# Patient Record
Sex: Female | Born: 1985 | Race: Black or African American | Hispanic: No | Marital: Single | Smoking: Current every day smoker
Health system: Southern US, Community
[De-identification: ages and names within clinical notes are randomized; demographics above are authoritative.]

## PROBLEM LIST (undated history)

## (undated) DIAGNOSIS — T7840XA Allergy, unspecified, initial encounter: Secondary | ICD-10-CM

## (undated) DIAGNOSIS — R011 Cardiac murmur, unspecified: Secondary | ICD-10-CM

## (undated) HISTORY — PX: KNEE SURGERY: SHX244

## (undated) HISTORY — DX: Allergy, unspecified, initial encounter: T78.40XA

---
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
## 2010-03-03 ENCOUNTER — Emergency Department (HOSPITAL_COMMUNITY): Admission: EM | Admit: 2010-03-03 | Discharge: 2010-03-04 | Payer: Self-pay | Admitting: Emergency Medicine

## 2010-03-05 ENCOUNTER — Ambulatory Visit (HOSPITAL_COMMUNITY): Admission: RE | Admit: 2010-03-05 | Discharge: 2010-03-05 | Payer: Self-pay | Admitting: Family Medicine

## 2010-03-05 ENCOUNTER — Emergency Department (HOSPITAL_COMMUNITY): Admission: EM | Admit: 2010-03-05 | Discharge: 2010-03-05 | Payer: Self-pay | Admitting: Family Medicine

## 2010-03-05 IMAGING — US US ABDOMEN COMPLETE
1 series · 4 of 4 positions shown · non-contrast
Comparison: None.

CLINICAL DATA: Abdominal pain.

COMPLETE ABDOMINAL ULTRASOUND

[Series 1: us abdomen complete · 0.23mm/px · 4 of 4 slices shown]
[im 1/4]
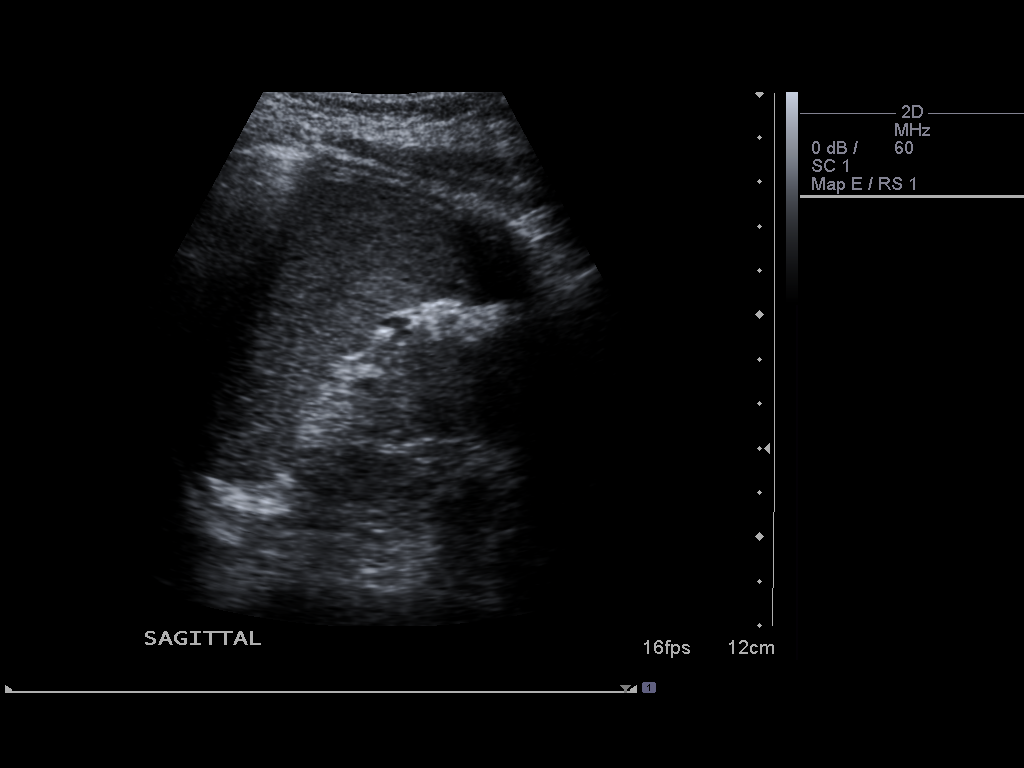
[im 2/4]
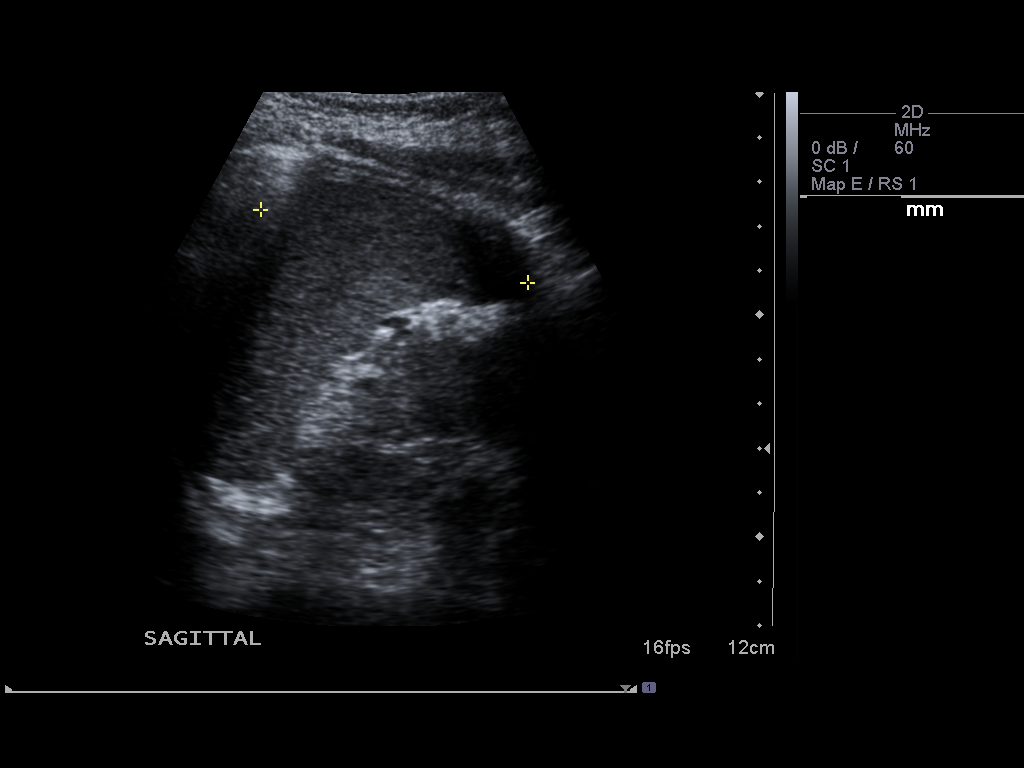
[im 3/4]
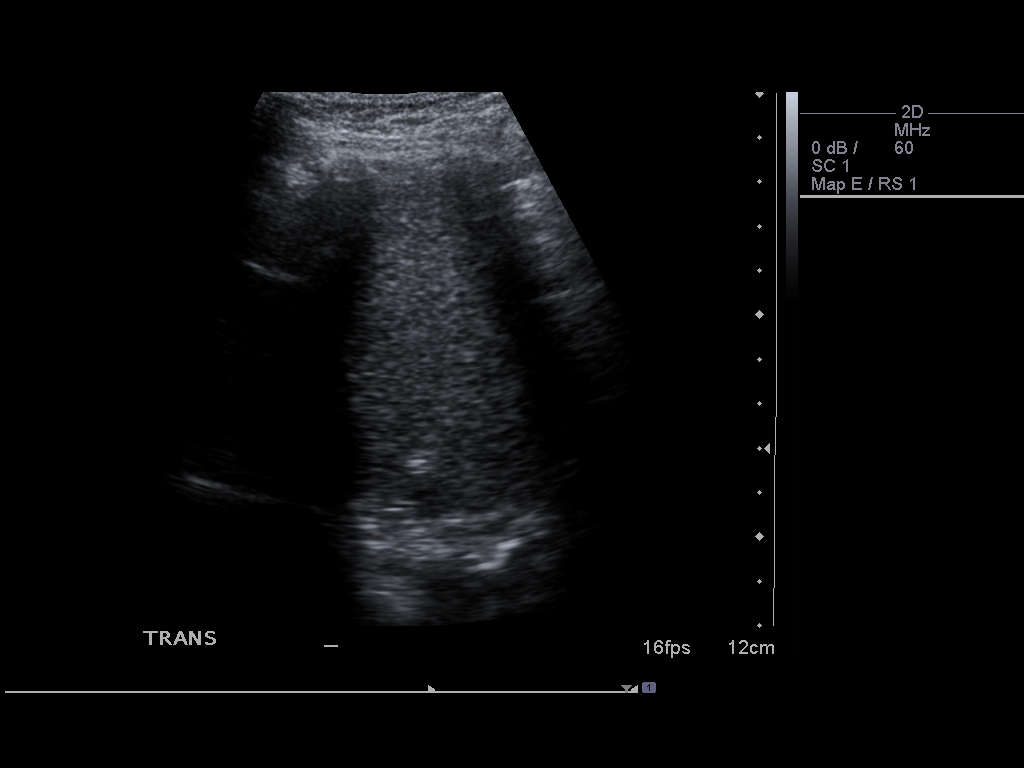
[im 4/4]
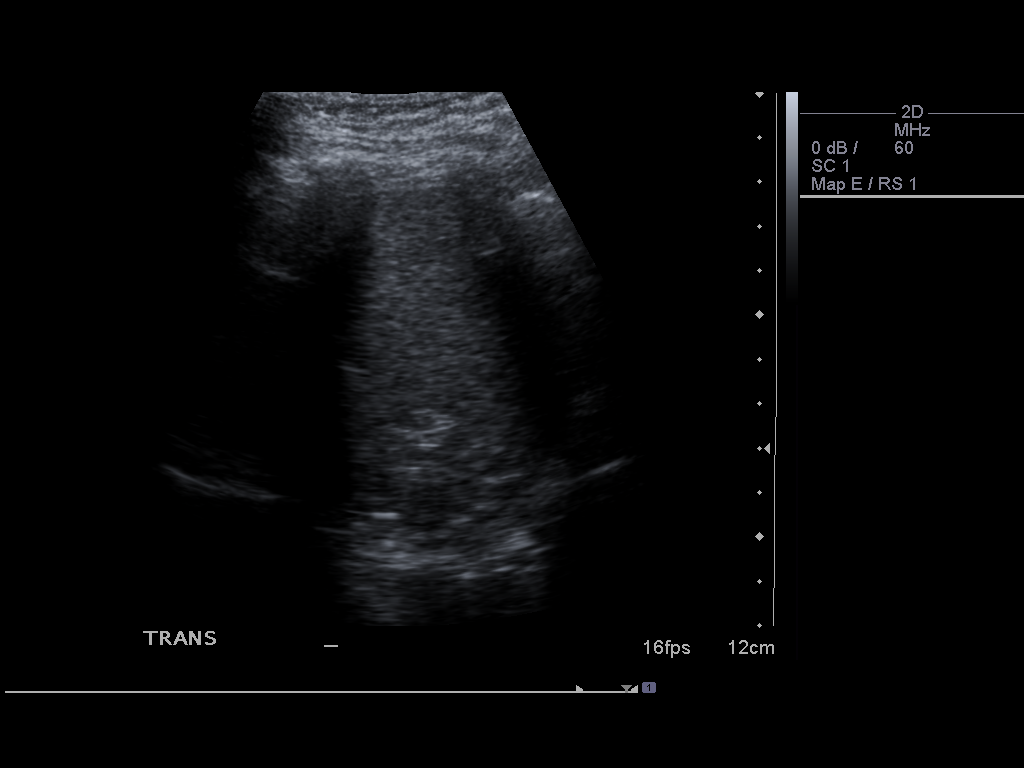

[4 of 4 positions shown; findings below may reference images not displayed]

FINDINGS: Gallbladder:  The gallbladder is nondistended.  There is no wall
thickening, stones, sludge, or pericholecystic fluid.  No
sonographic Murphy's sign.  Wall measures 1.5 mm.

Common bile duct:  2.5 mm, normal.

Liver:  No focal lesion identified.  Within normal limits in
parenchymal echogenicity.

IVC:  Appears normal.

Pancreas:  No focal abnormality seen.

Spleen:  6.2 cm.  Normal echotexture.

Right Kidney:  10.9 cm. Normal echotexture.  Normal central sinus
echo complex.  No calculi or hydronephrosis.

Left Kidney:  10.7 cm with dromedary hump. Normal echotexture.
Normal central sinus echo complex.  No calculi or hydronephrosis.

Abdominal aorta:  No aneurysm identified.
IMPRESSION: Negative abdominal ultrasound.

## 2010-03-05 IMAGING — US US ABDOMEN COMPLETE
1 series · 14 of 25 positions shown · non-contrast
Comparison: None.

CLINICAL DATA: Abdominal pain.

COMPLETE ABDOMINAL ULTRASOUND

[Series 1: us abdomen complete · 0.24mm/px · 14 of 76 slices shown]
[im 1/76]
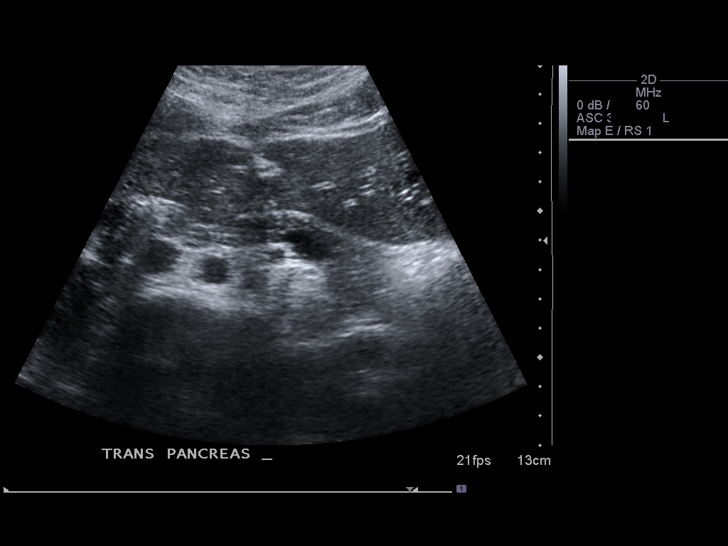
[im 7/76]
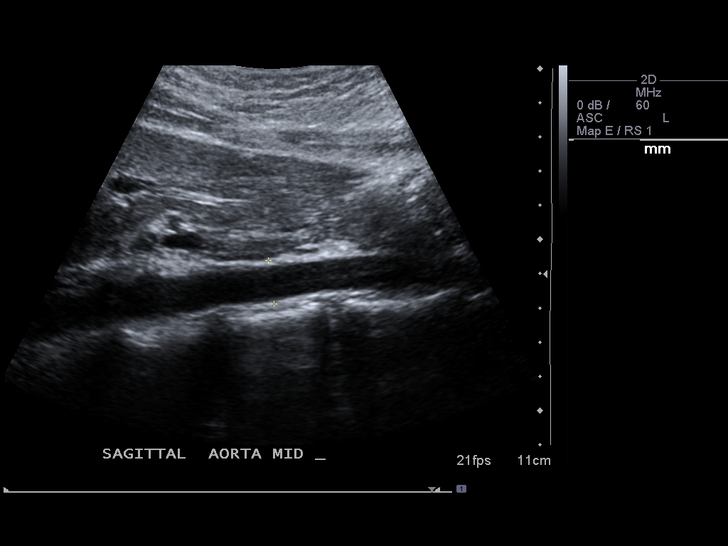
[im 13/76]
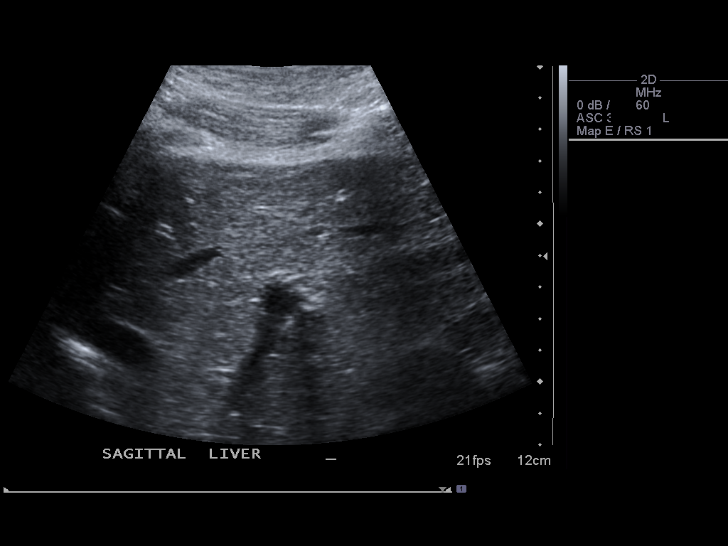
[im 19/76]
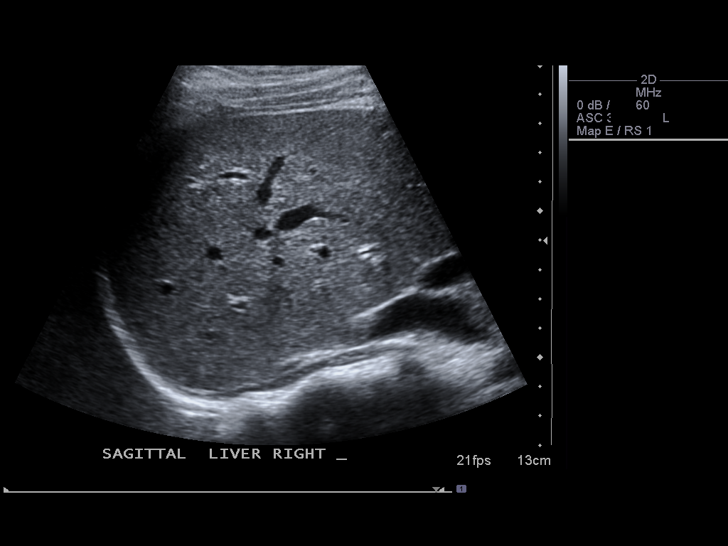
[im 26/76]
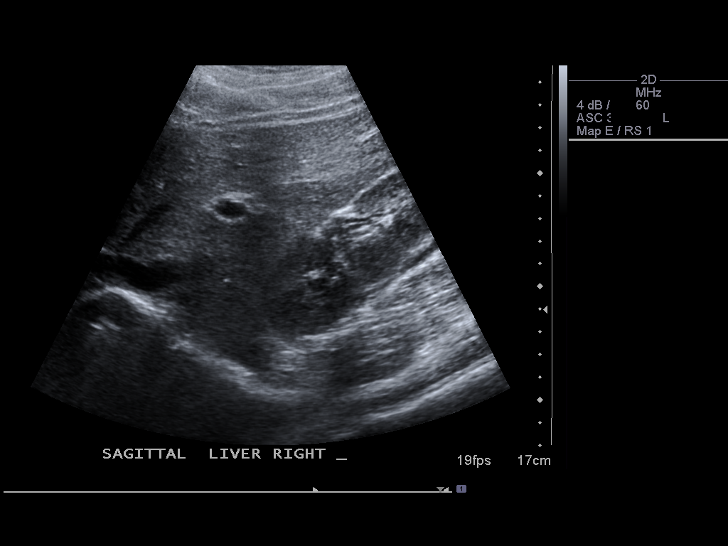
[im 29/76]
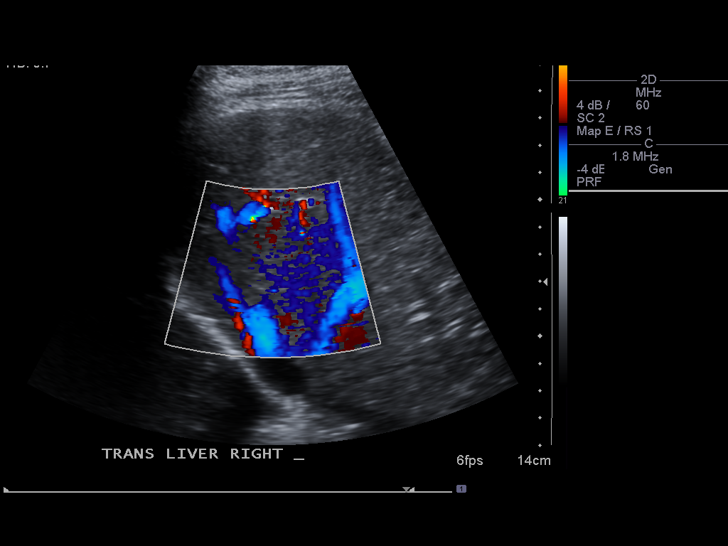
[im 35/76]
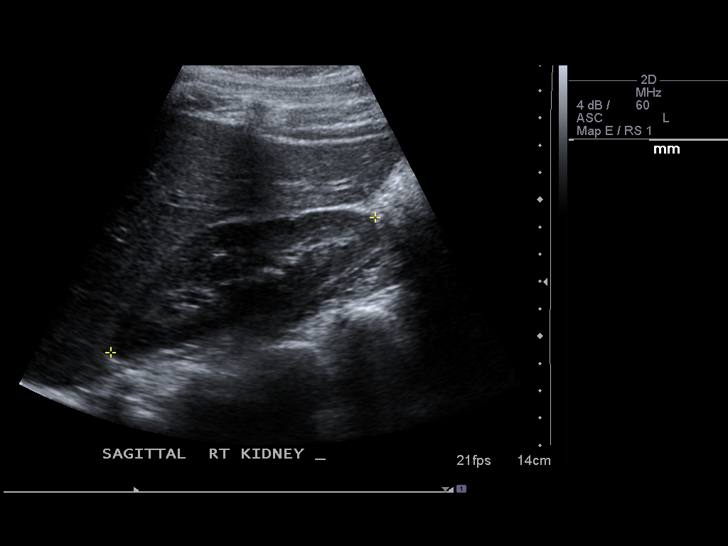
[im 41/76]
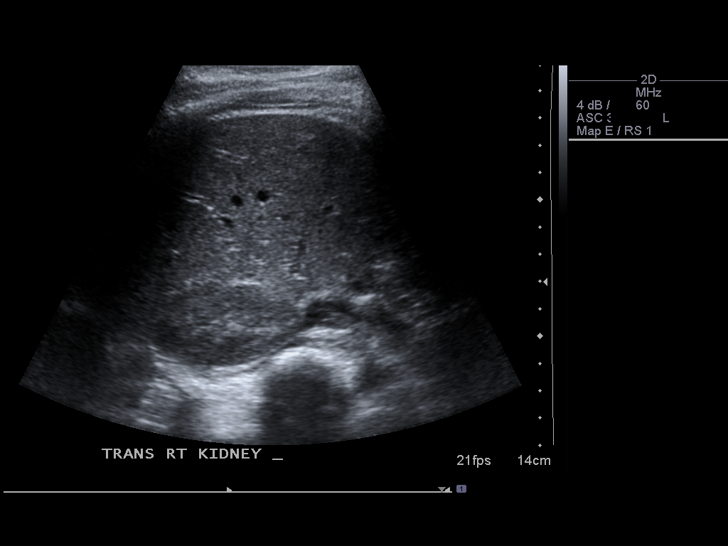
[im 47/76]
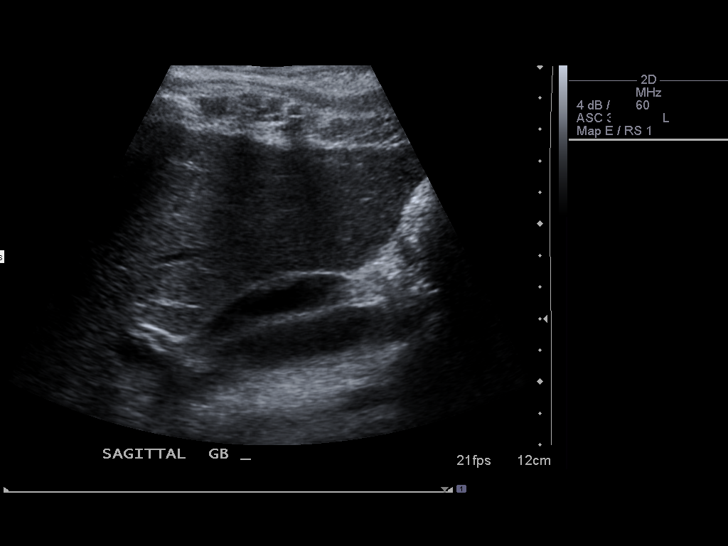
[im 51/76]
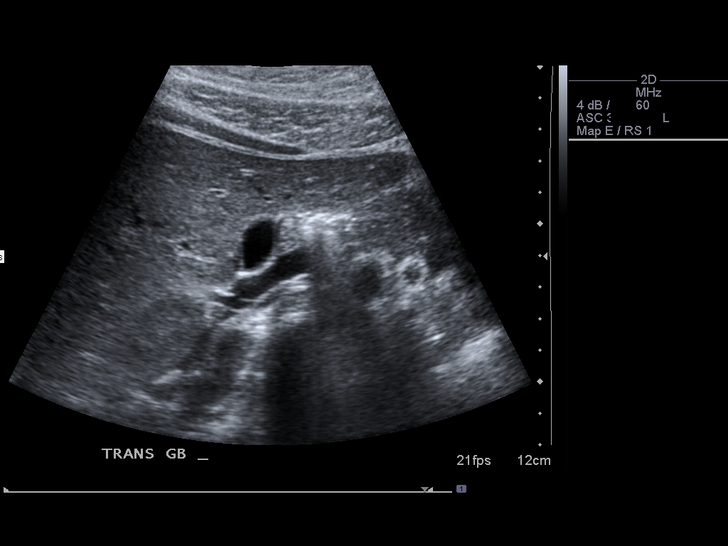
[im 57/76]
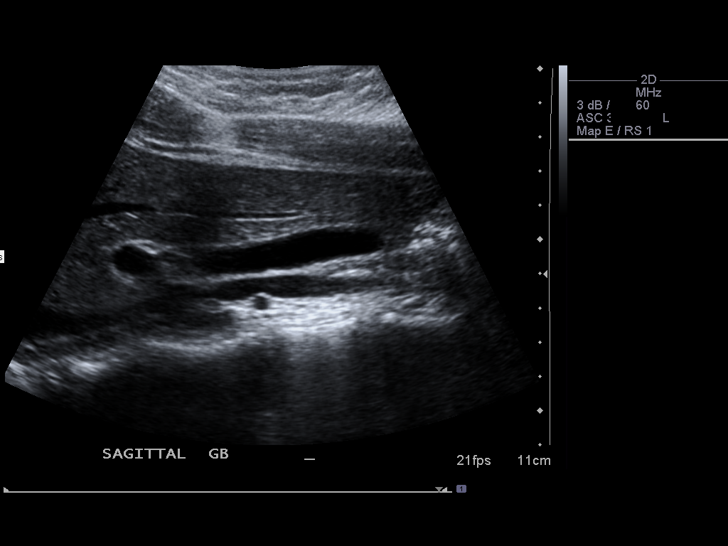
[im 63/76]
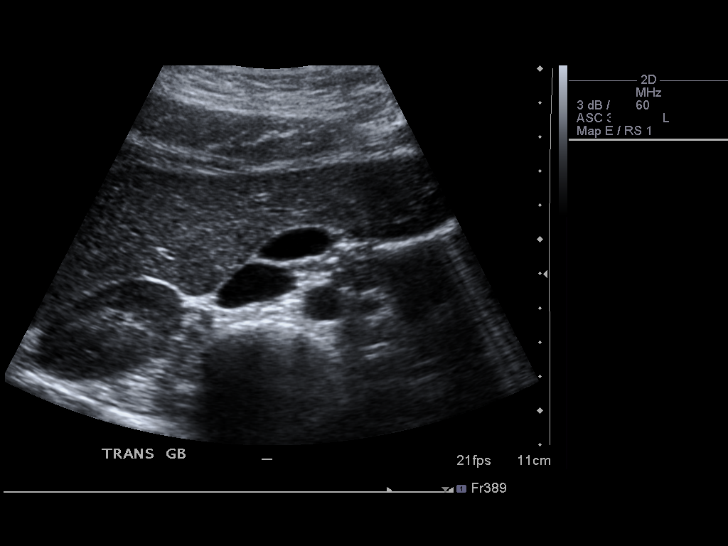
[im 69/76]
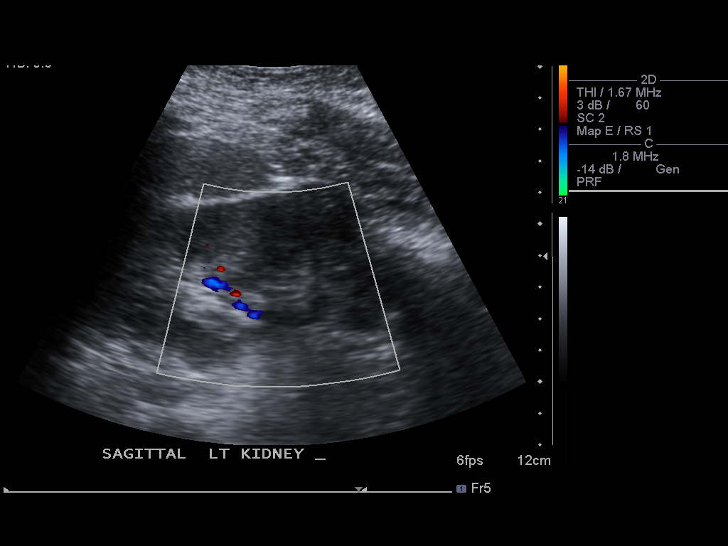
[im 76/76]
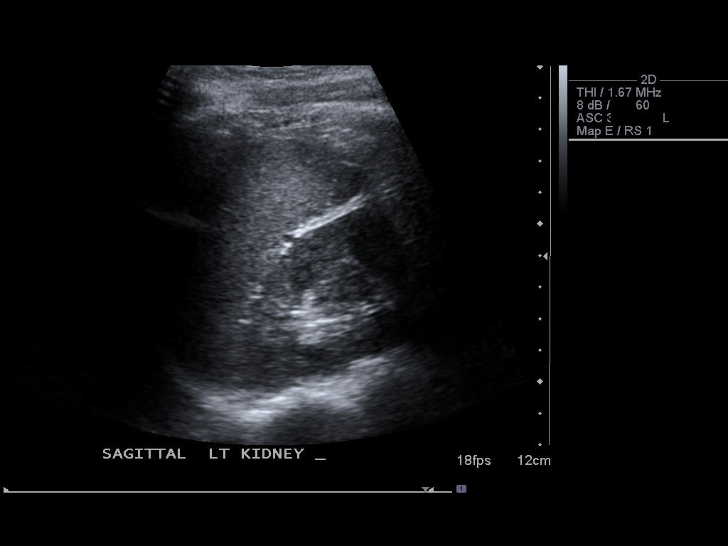

[14 of 25 positions shown; findings below may reference images not displayed]

FINDINGS: Gallbladder:  The gallbladder is nondistended.  There is no wall
thickening, stones, sludge, or pericholecystic fluid.  No
sonographic Murphy's sign.  Wall measures 1.5 mm.

Common bile duct:  2.5 mm, normal.

Liver:  No focal lesion identified.  Within normal limits in
parenchymal echogenicity.

IVC:  Appears normal.

Pancreas:  No focal abnormality seen.

Spleen:  6.2 cm.  Normal echotexture.

Right Kidney:  10.9 cm. Normal echotexture.  Normal central sinus
echo complex.  No calculi or hydronephrosis.

Left Kidney:  10.7 cm with dromedary hump. Normal echotexture.
Normal central sinus echo complex.  No calculi or hydronephrosis.

Abdominal aorta:  No aneurysm identified.
IMPRESSION: Negative abdominal ultrasound.

## 2010-08-01 ENCOUNTER — Emergency Department (HOSPITAL_COMMUNITY)
Admission: EM | Admit: 2010-08-01 | Discharge: 2010-08-01 | Payer: Self-pay | Source: Home / Self Care | Admitting: Family Medicine

## 2010-11-06 LAB — POCT URINALYSIS DIP (DEVICE)
Bilirubin Urine: NEGATIVE
Glucose, UA: NEGATIVE mg/dL
Ketones, ur: NEGATIVE mg/dL
Nitrite: NEGATIVE
Protein, ur: NEGATIVE mg/dL
Specific Gravity, Urine: 1.015 (ref 1.005–1.030)
Urobilinogen, UA: 1 mg/dL (ref 0.0–1.0)
pH: 7 (ref 5.0–8.0)

## 2010-11-07 LAB — URINALYSIS, ROUTINE W REFLEX MICROSCOPIC
Bilirubin Urine: NEGATIVE
Glucose, UA: NEGATIVE mg/dL
Hgb urine dipstick: NEGATIVE
Protein, ur: NEGATIVE mg/dL
Urobilinogen, UA: 1 mg/dL (ref 0.0–1.0)

## 2011-07-10 ENCOUNTER — Encounter: Payer: Self-pay | Admitting: *Deleted

## 2011-07-10 ENCOUNTER — Emergency Department (INDEPENDENT_AMBULATORY_CARE_PROVIDER_SITE_OTHER)
Admission: EM | Admit: 2011-07-10 | Discharge: 2011-07-10 | Disposition: A | Discharge status: Home / Self Care | Payer: 59 | Source: Home / Self Care | Attending: Family Medicine | Admitting: Family Medicine

## 2011-07-10 DIAGNOSIS — N946 Dysmenorrhea, unspecified: Secondary | ICD-10-CM

## 2011-07-10 HISTORY — DX: Cardiac murmur, unspecified: R01.1

## 2011-07-10 LAB — POCT PREGNANCY, URINE: Preg Test, Ur: NEGATIVE

## 2011-07-10 LAB — POCT URINALYSIS DIP (DEVICE)
Protein, ur: 30 mg/dL — AB
Specific Gravity, Urine: 1.02 (ref 1.005–1.030)
Urobilinogen, UA: 1 mg/dL (ref 0.0–1.0)
pH: 6 (ref 5.0–8.0)

## 2011-07-10 NOTE — ED Provider Notes (Signed)
History     CSN: [id number] Arrival date & time: 07/10/2011 10:18 AM   First MD Initiated Contact with Patient 07/10/11 [phone]      Chief Complaint  Patient presents with  . Abdominal Pain    pt with generalized abdominal pain x one week - vaginal discharge x 4 days - onset of menstrual cycle yesterday     (Consider location/radiation/quality/duration/timing/severity/associated sxs/prior treatment) Patient is a 25 y.o. female presenting with vaginal discharge. The history is provided by the patient.  Vaginal Discharge This is a new problem. Episode onset: 4 days ago. The problem has not changed since onset.Associated symptoms include abdominal pain. Associated symptoms comments: Cramp like pain . Her menstrual period started yestereday. The symptoms are relieved by NSAIDs.    Past Medical History  Diagnosis Date  . Heart murmur     Past Surgical History  Procedure Date  . Knee surgery     History reviewed. No pertinent family history.  History  Substance Use Topics  . Smoking status: Current Everyday Smoker  . Smokeless tobacco: Not on file  . Alcohol Use: Yes    OB History    Grav Para Term Preterm Abortions TAB SAB Ect Mult Living                  Review of Systems  Constitutional: Negative.   HENT: Negative.   Respiratory: Negative.   Cardiovascular: Negative.   Gastrointestinal: Positive for abdominal pain. Negative for nausea, vomiting, diarrhea and constipation.  Genitourinary: Positive for vaginal discharge. Negative for dysuria, urgency, frequency, hematuria and flank pain.  Musculoskeletal: Negative.   Skin: Negative.     Allergies  Amoxicillin  Home Medications   Current Outpatient Rx  Name Route Sig Dispense Refill  . IBUPROFEN 200 MG PO TABS Oral Take 400 mg by mouth every 6 (six) hours as needed.        BP 129/79  Pulse 77  Temp(Src) 98.9 F (37.2 C) (Oral)  Resp 18  SpO2 100%  LMP 07/09/2011  Physical Exam  Constitutional: She  appears well-developed and well-nourished. No distress.  Neck: Normal range of motion. Neck supple. Thyromegaly present.  Cardiovascular: Normal rate, regular rhythm and normal heart sounds.   Pulmonary/Chest: Effort normal and breath sounds normal.  Abdominal: Soft.  Genitourinary:       Pelvic exam with female nursing personal Clydie Braun) assisting reveals no skin or vulvar lesion. No discharge. Small amount of menstrual bleeding. No cmt . Sample collected and sent.     ED Course  Procedures (including critical care time)  Labs Reviewed  POCT URINALYSIS DIP (DEVICE) - Abnormal; Notable for the following:    Glucose, UA 100 (*)    Bilirubin Urine SMALL (*)    Ketones, ur TRACE (*)    Hgb urine dipstick LARGE (*)    Protein, ur 30 (*)    All other components within normal limits  POCT PREGNANCY, URINE  GLUCOSE, CAPILLARY  POCT URINALYSIS DIPSTICK  POCT PREGNANCY, URINE  POCT CBG MONITORING  GC/CHLAMYDIA PROBE AMP, GENITAL   No results found.   1. Menstrual cramps       MDM          Randa Spike, MD 07/10/11 1056

## 2011-07-12 LAB — GC/CHLAMYDIA PROBE AMP, GENITAL: Chlamydia, DNA Probe: NEGATIVE

## 2011-07-13 ENCOUNTER — Encounter (HOSPITAL_COMMUNITY): Payer: Self-pay | Admitting: *Deleted

## 2011-07-13 ENCOUNTER — Telehealth (HOSPITAL_COMMUNITY): Payer: Self-pay | Admitting: *Deleted

## 2011-07-13 ENCOUNTER — Emergency Department (HOSPITAL_COMMUNITY)
Admission: EM | Admit: 2011-07-13 | Discharge: 2011-07-13 | Disposition: A | Discharge status: Home / Self Care | Payer: 59 | Source: Home / Self Care

## 2011-07-13 MED ORDER — CEFTRIAXONE SODIUM 250 MG IJ SOLR
INTRAMUSCULAR | Status: AC
  Filled 2011-07-13: qty 250

## 2011-07-13 MED ORDER — LIDOCAINE HCL (PF) 1 % IJ SOLN
INTRAMUSCULAR | Status: AC
  Filled 2011-07-13: qty 5

## 2011-07-13 MED ORDER — CEFTRIAXONE SODIUM 250 MG IJ SOLR
250.0000 mg | Freq: Once | INTRAMUSCULAR | Status: AC
  Administered 2011-07-13: 250 mg via INTRAMUSCULAR

## 2011-07-13 NOTE — ED Notes (Signed)
Dr. Lorenza Chick reviewed labs and ordered Rocephin 250mg  IM x 1 dose for pt.  Pt called and notified.  Checked with pt about allergy to Amoxicillin and she states it causes n/v, no rash or swelling.  Pt states she will return here shortly to get injection.

## 2011-07-13 NOTE — ED Notes (Signed)
Pt was called this afternoon to return for an antibiotic injection for postive Gonorrhea

## 2011-12-23 ENCOUNTER — Ambulatory Visit (INDEPENDENT_AMBULATORY_CARE_PROVIDER_SITE_OTHER): Payer: 59 | Admitting: Internal Medicine

## 2011-12-23 ENCOUNTER — Ambulatory Visit: Payer: 59

## 2011-12-23 VITALS — BP 126/90 | HR 84 | Temp 98.2°F | Resp 18 | Ht 64.0 in | Wt 133.0 lb

## 2011-12-23 DIAGNOSIS — R079 Chest pain, unspecified: Secondary | ICD-10-CM

## 2011-12-23 DIAGNOSIS — R059 Cough, unspecified: Secondary | ICD-10-CM

## 2011-12-23 DIAGNOSIS — R05 Cough: Secondary | ICD-10-CM

## 2011-12-23 DIAGNOSIS — J45909 Unspecified asthma, uncomplicated: Secondary | ICD-10-CM

## 2011-12-23 DIAGNOSIS — J40 Bronchitis, not specified as acute or chronic: Secondary | ICD-10-CM

## 2011-12-23 LAB — POCT CBC
Lymph, poc: 1.6 (ref 0.6–3.4)
MCH, POC: 26.3 pg — AB (ref 27–31.2)
MCHC: 32.3 g/dL (ref 31.8–35.4)
MCV: 81.4 fL (ref 80–97)
MID (cbc): 0.4 (ref 0–0.9)
POC LYMPH PERCENT: 29 %L (ref 10–50)
Platelet Count, POC: 175 10*3/uL (ref 142–424)
RBC: 4.86 M/uL (ref 4.04–5.48)
WBC: 5.6 10*3/uL (ref 4.6–10.2)

## 2011-12-23 IMAGING — CR DG CHEST 2V
2 series · 2 of 2 positions shown · non-contrast
Comparison: None

CLINICAL DATA: Cough and chest pain.

CHEST - 2 VIEW

[PA]
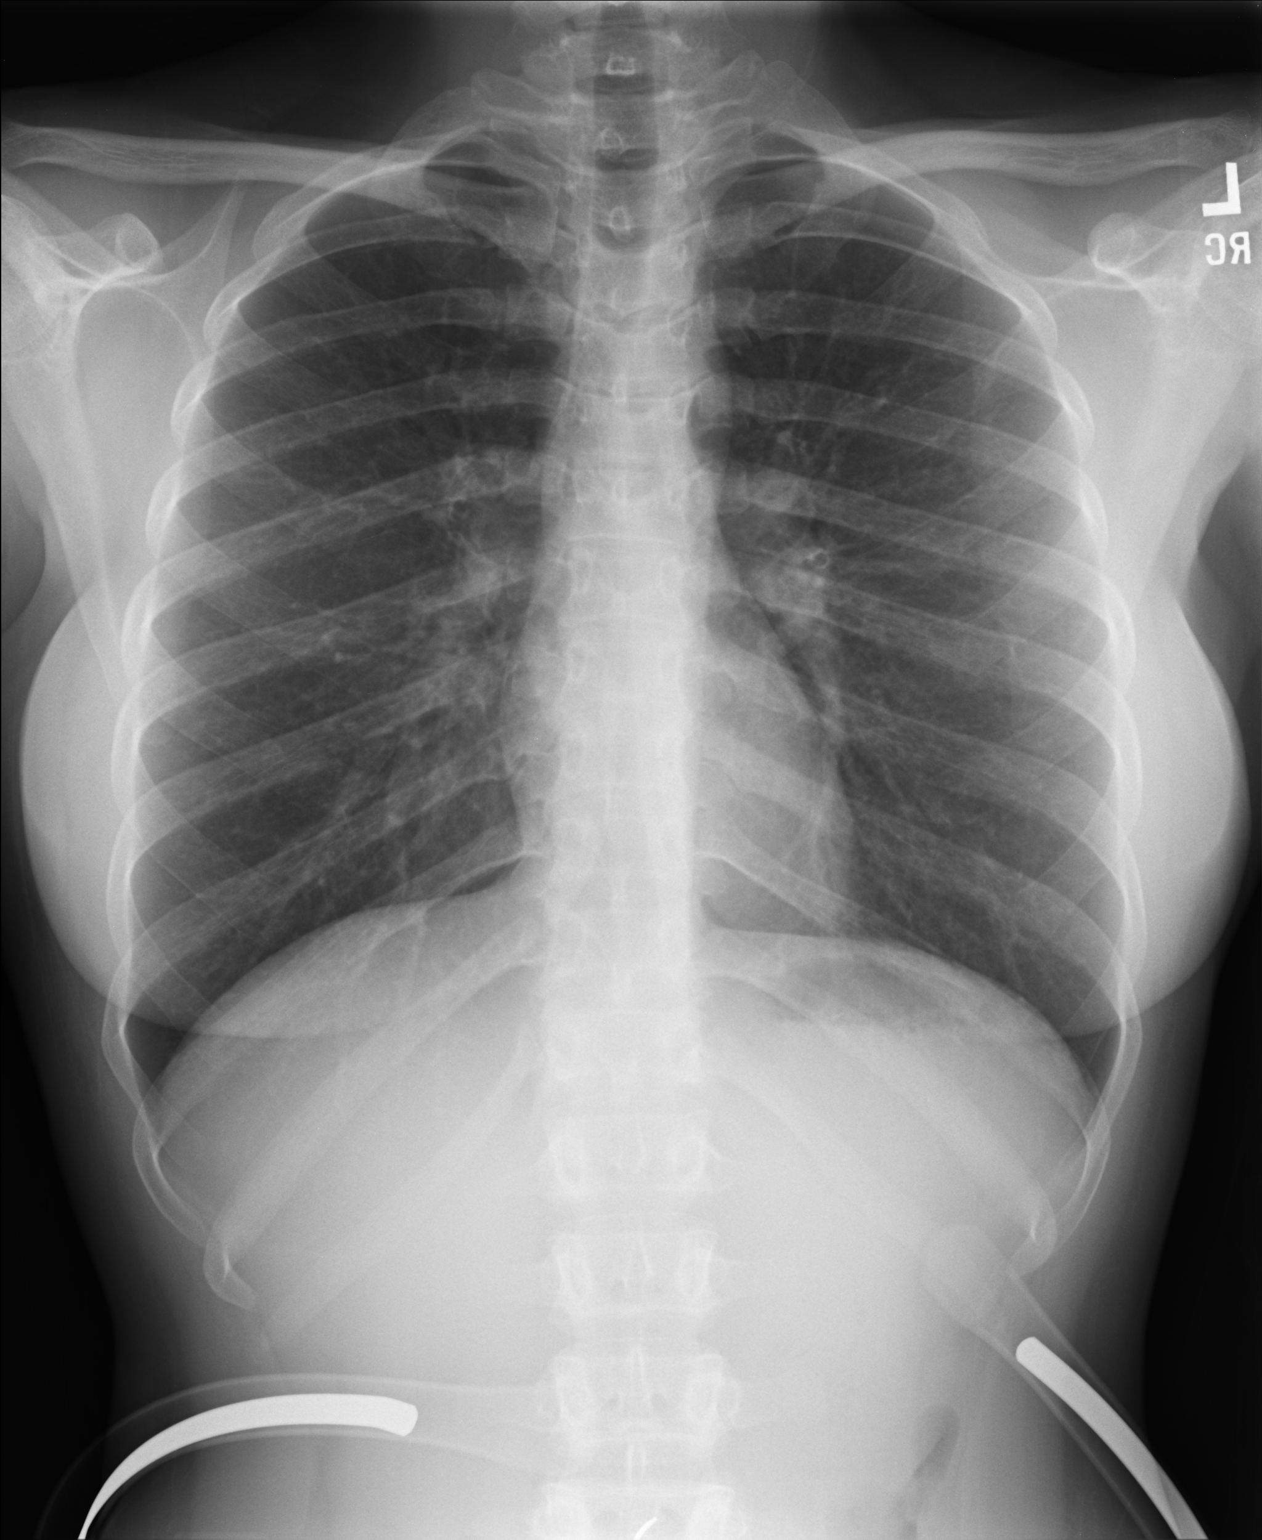

[lateral]
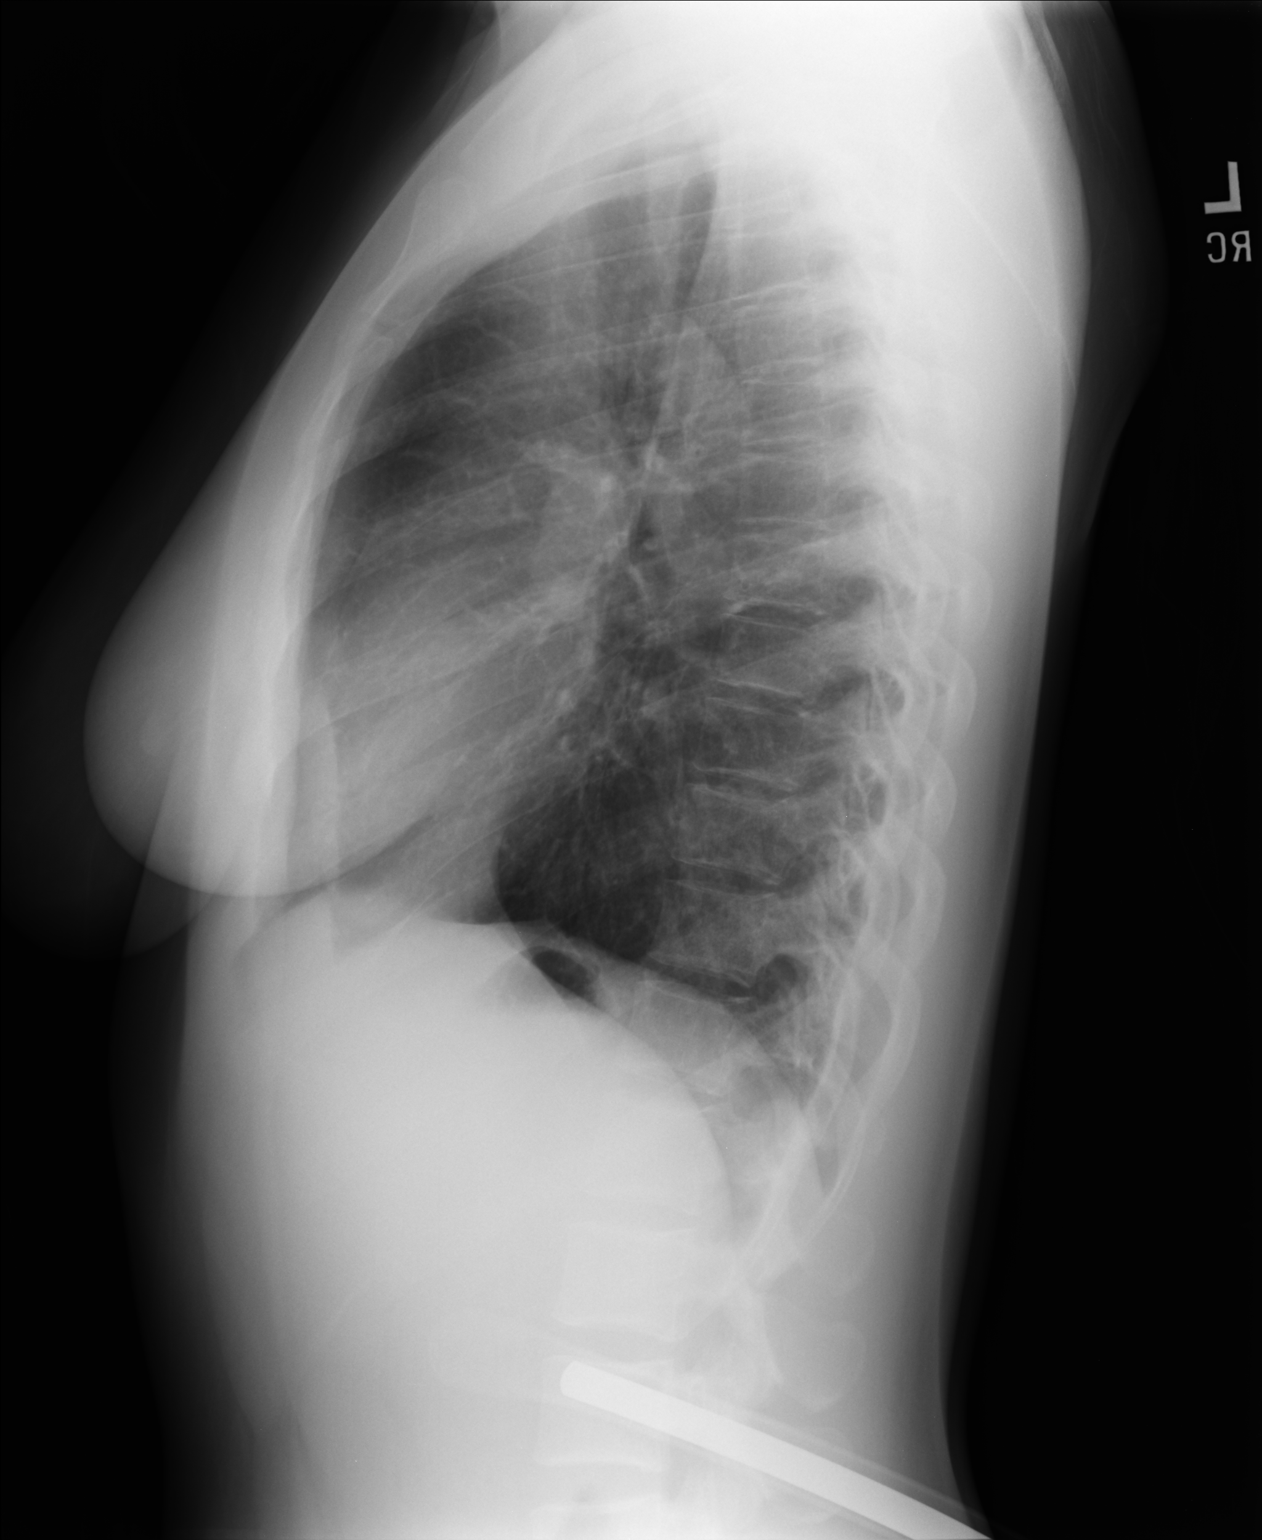

[2 of 2 positions shown; findings below may reference images not displayed]

FINDINGS: The cardiac silhouette, mediastinal and hilar contours
are normal.  There is mild peribronchial thickening and slight
hyperinflation which could suggest bronchitis.  No focal
infiltrates or effusions.  The bony thorax is intact.
IMPRESSION: Mild peribronchial thickening may suggest bronchitis.  No focal
infiltrates.

Clinically significant discrepancy from primary report, if
provided: None

## 2011-12-23 MED ORDER — AZITHROMYCIN 500 MG PO TABS: 500.0000 mg | ORAL_TABLET | Freq: Every day | ORAL | Status: AC

## 2011-12-23 MED ORDER — HYDROCODONE-ACETAMINOPHEN 7.5-500 MG/15ML PO SOLN: 5.0000 mL | Freq: Four times a day (QID) | ORAL | Status: AC | PRN

## 2011-12-23 MED ORDER — ALBUTEROL SULFATE HFA 108 (90 BASE) MCG/ACT IN AERS: 2.0000 | INHALATION_SPRAY | Freq: Four times a day (QID) | RESPIRATORY_TRACT | Status: DC | PRN

## 2011-12-23 NOTE — Patient Instructions (Signed)
Chronic Asthmatic Bronchitis  Chronic asthmatic bronchitis is often a complication of frequent asthma and/or bronchitis. After a long enough period of time, the continual airflow blockage is present in spite of treatment for asthma. The medications that used to treat asthma no longer work. The symptoms of chronic bronchitis may also be present. Bronchitis is an inflammation of the breathing tubules in the lungs. The combination of asthma, chronic bronchitis, and emphysema all affect the small breathing tubules (bronchial tree) in our lungs. It is a common condition. The problems from each are similar and overlap with each other so are sometimes hard to diagnose.  When the asthma and bronchitis are combined, there is usually inflammation and infection. The small bronchial tubes produce more mucus. This blocks the airways and makes breathing harder. Usually this process is caused more by external irritants than infection. Smokers with chronic bronchitis are at a greater risk to develop asthmatic bronchitis.  CAUSES    Why some people with asthma go on to develop chronic asthmatic bronchitis is not known. Smoking and environmental toxins or allergens seem to play a role. There are wide differences in who is susceptible.   Abnormalities of the small airways may develop in persons with persistent asthma. Asthmatics can be uncommonly subject to the effects of smoking. Asthma is also found associated with a number of other diseases.  SYMPTOMS   Asthma, chronic bronchitis, and emphysema all cause symptoms of cough, wheezing, shortness of breath, and recurring infections. There may also be chest discomfort. All of the above symptoms happen more often in chronic asthmatic bronchitis.  DIAGNOSIS    Asthma, chronic bronchitis, and emphysema all affect the entire bronchial tree. This makes it difficult on exam to tell them apart. Other tests of the lungs are done to prove a diagnosis. These are called pulmonary function  tests.  TREATMENT    The asthmatic condition itself must always be treated.   Infection can be treated with antibiotics (medications to kill germs).   Serious infections may require hospitalization. These can include pneumonia, sinus infections, and acute bronchitis.  HOME CARE INSTRUCTIONS   Use prescription medications as ordered by your caregiver.   Avoid pollen, dust, animal dander, molds, smoke, and other things that cause attacks at home and at work.   You may have fewer attacks if you decrease dust in your home. Electrostatic air cleaners may help.   It may help to replace your pillows or mattress with materials less likely to cause allergies.   If you are not on fluid restriction, drink 8 to 10 glasses of water each day.   Discuss possible exercise routines with your caregiver.   If animal dander is the cause of asthma, you may need to get rid of pets.   It is important that you:   Become educated about your medical condition.   Participate in maintaining wellness.   Seek medical care promptly or immediately as indicated below.   Delay in seeking medical attention could cause permanent injury and may be a risk to your life.  SEEK MEDICAL CARE IF   You have wheezing and shortness of breath even if taking medicine to prevent attacks.   An oral temperature above 102 F (38.9 C)   You have muscle aches, chest pain, or thickening of sputum.   Your sputum changes from clear or white to yellow, green, gray, or bloody.   You have any problems that may be related to the medicine you are taking (such as   a rash, itching, swelling, or trouble breathing).  SEEK IMMEDIATE MEDICAL CARE IF:   Your usual medicines do not stop your wheezing.   There is increased coughing and/or shortness of breath.   You have increased difficulty breathing.  MAKE SURE YOU:    Understand these instructions.   Will watch your condition.   Will get help right away if you are not doing well or get worse.  Document  Released: 05/26/2006 Document Revised: 07/28/2011 Document Reviewed: 07/24/2007  [employer] Patient Information 2012 [employer].

## 2011-12-23 NOTE — Progress Notes (Signed)
  Subjective:    Patient ID: Deborah Owens, female    DOB: [birth year], 26 y.o.   MRN: [id number]  HPI Cough and chest pain with cough, not sob, oximetry 98% Minimal sputum , no hemoptysis No hx of smoking or asthma   Review of Systems Dr. Perlie Gold, gyn    Objective:   Physical Exam Nasal rhinorrhea, throat not red Lungs rhonchi, wheezes most fields, no rales  [facility] reading (PRIMARY) by  Dr.Guest normal cxr       Assessment & Plan:  Zithromax 500mg  x5d lortab elixir 6oz

## 2012-10-01 ENCOUNTER — Ambulatory Visit (INDEPENDENT_AMBULATORY_CARE_PROVIDER_SITE_OTHER): Payer: 59 | Admitting: Emergency Medicine

## 2012-10-01 VITALS — BP 157/98 | HR 84 | Temp 98.3°F | Resp 18 | Ht 64.0 in | Wt 148.0 lb

## 2012-10-01 DIAGNOSIS — J111 Influenza due to unidentified influenza virus with other respiratory manifestations: Secondary | ICD-10-CM

## 2012-10-01 MED ORDER — OSELTAMIVIR PHOSPHATE 75 MG PO CAPS: 75.0000 mg | ORAL_CAPSULE | Freq: Two times a day (BID) | ORAL | Status: DC

## 2012-10-01 MED ORDER — HYDROCOD POLST-CHLORPHEN POLST 10-8 MG/5ML PO LQCR: 5.0000 mL | Freq: Two times a day (BID) | ORAL | Status: DC | PRN

## 2012-10-01 NOTE — Progress Notes (Signed)
[facility] [address] [place] [address] [id number]  Date:  10/01/2012   Name:  Deborah Owens   DOB:  1986/02/28   MRN:  [id number]  PCP:  Jeani Hawking, MD    Chief Complaint: Cough, Sore Throat, Headache and Chills   History of Present Illness:  Deborah Owens is a 27 y.o. very pleasant female patient who presents with the following:  Ill 3 days with cough, fever, chills, nasal congestion and post nasal drip and sore. No wheezing or shortness of breath.  No nausea or vomiting.  No improvement with OTC medications.  Malaise and arthralgias. No flu shot.  There is no problem list on file for this patient.   Past Medical History  Diagnosis Date  . Heart murmur   . Allergy     Past Surgical History  Procedure Laterality Date  . Knee surgery      History  Substance Use Topics  . Smoking status: Former Games developer  . Smokeless tobacco: Not on file  . Alcohol Use: Yes     Comment: Socially    History reviewed. No pertinent family history.  Allergies  Allergen Reactions  . Amoxicillin Nausea And Vomiting    Medication list has been reviewed and updated.  Current Outpatient Prescriptions on File Prior to Visit  Medication Sig Dispense Refill  . ibuprofen (ADVIL,MOTRIN) 200 MG tablet Take 400 mg by mouth every 6 (six) hours as needed.        Marland Kitchen albuterol (PROVENTIL HFA;VENTOLIN HFA) 108 (90 BASE) MCG/ACT inhaler Inhale 2 puffs into the lungs every 6 (six) hours as needed for wheezing.  1 Inhaler  1   No current facility-administered medications on file prior to visit.    Review of Systems:  As per HPI, otherwise negative.   Physical Examination: Filed Vitals:   10/01/12 1246  BP: 157/98  Pulse: 84  Temp: 98.3 F (36.8 C)  Resp: 18   Filed Vitals:   10/01/12 1246  Height: 5\' 4"  (1.626 m)  Weight: 148 lb (67.132 kg)   Body mass index is 25.39 kg/(m^2). Ideal Body Weight: Weight in (lb) to have BMI = 25: 145.3  GEN: WDWN,  NAD, Non-toxic, A & O x 3  No rash or sepsis HEENT: Atraumatic, Normocephalic. Neck supple. No masses, No LAD. Ears and Nose: No external deformity. CV: RRR, No M/G/R. No JVD. No thrill. No extra heart sounds. PULM: CTA B, no wheezes, crackles, rhonchi. No retractions. No resp. distress. No accessory muscle use. ABD: S, NT, ND, +BS. No rebound. No HSM. EXTR: No c/c/e NEURO Normal gait.  PSYCH: Normally interactive. Conversant. Not depressed or anxious appearing.  Calm demeanor.    Assessment and Plan: Influenza tamiflu tussionex Follow up as needed  Carmelina Dane, MD

## 2013-09-10 ENCOUNTER — Telehealth: Payer: Self-pay

## 2013-09-10 NOTE — Telephone Encounter (Signed)
PT WOULD LIKE TO COME BY AND PICK UP A COPY OF HER MEDICAL RECORDS. PLEASE CALL [phone] WHEN READY FOR PICK UP TOLD PT IT COULD TAKE UP TO 72HRS

## 2013-09-16 NOTE — Telephone Encounter (Signed)
Patient needs to fill out release of information and sign it.

## 2013-09-17 NOTE — Telephone Encounter (Signed)
Patient was not told that she needed to sign a release form to get her records request processed. She was upset that our office did not tell her. Patient came in the office and records were printed [facility] and no release form was signed but she did show ID. I did stress the importance of having a release form signed by the patient on file.

## 2016-08-25 ENCOUNTER — Ambulatory Visit (INDEPENDENT_AMBULATORY_CARE_PROVIDER_SITE_OTHER): Payer: Self-pay

## 2016-08-25 ENCOUNTER — Encounter (INDEPENDENT_AMBULATORY_CARE_PROVIDER_SITE_OTHER): Payer: Self-pay | Admitting: Orthopaedic Surgery

## 2016-08-25 ENCOUNTER — Ambulatory Visit (INDEPENDENT_AMBULATORY_CARE_PROVIDER_SITE_OTHER): Payer: Self-pay | Admitting: Orthopaedic Surgery

## 2016-08-25 DIAGNOSIS — M25561 Pain in right knee: Secondary | ICD-10-CM | POA: Diagnosis not present

## 2016-08-25 MED ORDER — NAPROXEN 500 MG PO TABS: 500.0000 mg | ORAL_TABLET | Freq: Two times a day (BID) | ORAL | 3 refills | Status: DC

## 2016-08-25 NOTE — Progress Notes (Signed)
   Office Visit Note   Patient: Deborah Owens           Date of Birth: January 22, 1986           MRN: [id number] Visit Date: 08/25/2016              Requested by: Marcelle OverlieMichelle Grewal, MD [address] [place], [place] [id number] PCP: Jeani HawkingGREWAL,MICHELLE L, MD   Assessment & Plan: Visit Diagnoses:  1. Acute pain of right knee     Plan: Impression is right knee pain. I'm going to put her on 2 weeks of Naprosyn twice daily with rest ice and elevation. If this does not improve she should follow-up we may consider advanced imaging.  Follow-Up Instructions: Return if symptoms worsen or fail to improve.   Orders:  Orders Placed This Encounter  Procedures  . XR KNEE 3 VIEW RIGHT   Meds ordered this encounter  Medications  . naproxen (NAPROSYN) 500 MG tablet    Sig: Take 1 tablet (500 mg total) by mouth 2 (two) times daily with a meal.    Dispense:  30 tablet    Refill:  3      Procedures: No procedures performed   Clinical Data: No additional findings.   Subjective: Chief Complaint  Patient presents with  . 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 Ophelia CharterYates. She states that for the last 2 weeks she's had this pain on the lateral aspect of her knee. She denies any injuries. Her pain is a constant 7 out of 10 pain with stiffness. She denies any mechanical symptoms. She did originally have swelling but now this is improved. She wants to mainly get it checked out. The pain does not radiate. There are no alleviating or exacerbating features.    Review of Systems Complete review of systems negative except for history of present illness  Objective: Vital Signs: There were no vitals taken for this visit.  Physical Exam Well-developed nourished acute distress alert 3, breathing normal judgments affect abdomen soft no lymphadenopathy Ortho Exam Exam of the right knee shows no joint  effusion. She has guarding with palpation on the lateral aspect of her knee. There is no swelling or effusion or cellulitis or fluctuance or any signs of infection. There is no warmth. She has nonspecific tenderness about the lateral aspect of the knee. Specialty Comments:  No specialty comments available.  Imaging: Xr Knee 3 View Right  Result Date: 08/25/2016 No acute findings.    PMFS History: There are no active problems to display for this patient.  Past Medical History:  Diagnosis Date  . Allergy   . Heart murmur     No family history on file.  Past Surgical History:  Procedure Laterality Date  . KNEE SURGERY     Social History   Occupational History  . Waitress [employer]    Social History Main Topics  . Smoking status: Former Games developermoker  . Smokeless tobacco: Not on file  . Alcohol use Yes     Comment: Socially  . Drug use: No  . Sexual activity: Not on file

## 2016-08-31 ENCOUNTER — Telehealth (INDEPENDENT_AMBULATORY_CARE_PROVIDER_SITE_OTHER): Payer: Self-pay

## 2016-08-31 NOTE — Telephone Encounter (Signed)
Per Misty StanleyLisa (insurance)...  Do you remember what DME we gave this pt?    " Good morning!Can you please verify the DME charges for this pt? Her 08/25/16 visit has 3 listed, including a finger splint. It looks like she was seen for her knee. Thank you!! "

## 2016-08-31 NOTE — Telephone Encounter (Signed)
Possibly a neoprene knee brace

## 2018-02-02 ENCOUNTER — Ambulatory Visit: Payer: Self-pay | Admitting: Nurse Practitioner

## 2018-02-02 ENCOUNTER — Encounter: Payer: Self-pay | Admitting: Nurse Practitioner

## 2018-02-02 VITALS — BP 120/78 | HR 85 | Temp 98.9°F | Wt 131.6 lb

## 2018-02-02 DIAGNOSIS — H6981 Other specified disorders of Eustachian tube, right ear: Secondary | ICD-10-CM

## 2018-02-02 MED ORDER — FLUTICASONE PROPIONATE 50 MCG/ACT NA SUSP: 2.0000 | Freq: Every day | NASAL | 0 refills | Status: DC

## 2018-02-02 MED ORDER — CETIRIZINE HCL 10 MG PO TABS: 10.0000 mg | ORAL_TABLET | Freq: Every day | ORAL | 11 refills | Status: DC

## 2018-02-02 NOTE — Progress Notes (Signed)
   Subjective:    Patient ID: Deborah Owens, female    DOB: 05-12-1986, 32 y.o.   MRN: [id number]  The patient is a 32 year old female who presents today for complaints of a right earache.  The patient states her symptoms started about 2 days ago.  Since that time the patient states she hears a "swishing" noise in her ear, and has had a very minor cough.  The patient denies fever, chills, rhinorrhea, coryza, or decreased appetite.  The patient states she has been using an over-the-counter eardrop, she could not recall the name, for her symptoms.  Symptoms have not improved with this medication.  The patient states she does take a medication for her allergies, but could not recall the name.  Otalgia   There is pain in the right ear. This is a new problem. The current episode started in the past 7 days. The problem occurs constantly. The problem has been unchanged. There has been no fever. The pain is at a severity of 7/10. The pain is moderate. Associated symptoms include coughing. Pertinent negatives include no abdominal pain, ear discharge, neck pain, rash, rhinorrhea, sore throat or vomiting. Treatments tried: over the counter ear drops. The treatment provided no relief. There is no history of a chronic ear infection or hearing loss.   Reviewed the patient's past medical history, current medications, and allergies.   Review of Systems  HENT: Positive for ear pain. Negative for ear discharge, rhinorrhea and sore throat.   Respiratory: Positive for cough.   Gastrointestinal: Negative for abdominal pain and vomiting.  Musculoskeletal: Negative for neck pain.  Skin: Negative for rash.       Objective:   Physical Exam  Constitutional: She is oriented to person, place, and time. She appears well-developed and well-nourished. No distress.  HENT:  Head: Normocephalic and atraumatic.  Right Ear: External ear normal.  Left Ear: External ear normal.  Nose: Nose normal.  Mouth/Throat: Oropharynx is  clear and moist.  Right TM with mucoid middle ear fluid, no erythema or bulging.  Left TM opaque, able to visualize landmarks. No bulging or erythema.  Eyes: Conjunctivae and EOM are normal.  Neck: Normal range of motion. Neck supple. No thyromegaly present.  Cardiovascular: Normal rate, regular rhythm and normal heart sounds.  Pulmonary/Chest: Effort normal and breath sounds normal.  Abdominal: Soft. Bowel sounds are normal.  Neurological: She is alert and oriented to person, place, and time. No cranial nerve deficit.  Skin: Skin is warm and dry. Capillary refill takes less than 2 seconds.  Psychiatric: She has a normal mood and affect.      Assessment & Plan:  Right eustachian tube dysfunction 1.  Patient was given a prescription for fluticasone steroid nasal spray.  2 sprays in each nostril daily for 10 days or until symptoms improve.  Patient was also given a prescription for Zyrtec 10 mg 1 tablet daily as needed for seasonal allergies. 2.  The patient was instructed to use ibuprofen 800 mg every 8 hours for 3 days. 3.  Warm compresses to the affected ear. 4.  Patient education provided. 5.  Patient will follow-up as needed or return sooner if symptoms worsen. 6.  Verbalizes understanding and patient has no questions at time of discharge.

## 2018-02-02 NOTE — Patient Instructions (Signed)
Eustachian Tube Dysfunction The eustachian tube connects the middle ear to the back of the nose. It regulates air pressure in the middle ear by allowing air to move between the ear and nose. It also helps to drain fluid from the middle ear space. When the eustachian tube does not function properly, air pressure, fluid, or both can build up in the middle ear. Eustachian tube dysfunction can affect one or both ears. What are the causes? This condition happens when the eustachian tube becomes blocked or cannot open normally. This may result from:  Ear infections.  Colds and other upper respiratory infections.  Allergies.  Irritation, such as from cigarette smoke or acid from the stomach coming up into the esophagus (gastroesophageal reflux).  Sudden changes in air pressure, such as from descending in an airplane.  Abnormal growths in the nose or throat, such as nasal polyps, tumors, or enlarged tissue at the back of the throat (adenoids).  What increases the risk? This condition may be more likely to develop in people who smoke and people who are overweight. Eustachian tube dysfunction may also be more likely to develop in children, especially children who have:  Certain birth defects of the mouth, such as cleft palate.  Large tonsils and adenoids.  What are the signs or symptoms? Symptoms of this condition may include:  A feeling of fullness in the ear.  Ear pain.  Clicking or popping noises in the ear.  Ringing in the ear.  Hearing loss.  Loss of balance.  Symptoms may get worse when the air pressure around you changes, such as when you travel to an area of high elevation or fly on an airplane. How is this diagnosed? This condition may be diagnosed based on:  Your symptoms.  A physical exam of your ear, nose, and throat.  Tests, such as those that measure: ? The movement of your eardrum (tympanogram). ? Your hearing (audiometry).  How is this treated? Treatment  depends on the cause and severity of your condition. If your symptoms are mild, you may be able to relieve your symptoms by moving air into ("popping") your ears. If you have symptoms of fluid in your ears, treatment may include:  Decongestants.  Antihistamines.  Nasal sprays or ear drops that contain medicines that reduce swelling (steroids).  In some cases, you may need to have a procedure to drain the fluid in your eardrum (myringotomy). In this procedure, a small tube is placed in the eardrum to:  Drain the fluid.  Restore the air in the middle ear space.  Follow these instructions at home:  Take over-the-counter and prescription medicines only as told by your health care provider.  Use techniques to help pop your ears as recommended by your health care provider. These may include: ? Chewing gum. ? Yawning. ? Frequent, forceful swallowing. ? Closing your mouth, holding your nose closed, and gently blowing as if you are trying to blow air out of your nose.  Do not do any of the following until your health care provider approves: ? Travel to high altitudes. ? Fly in airplanes. ? Work in a Estate agentpressurized cabin or room. ? Scuba dive.  Keep your ears dry. Dry your ears completely after showering or bathing.  Do not smoke.  Take Zyrtec daily as prescribed.  Take Ibuprofen 800mg  three times daily for 3 days.   Fluticasone nasal spray- 2 sprays in each nostril daily until symptoms improve. Keep all follow-up visits as told by your health care  provider. This is important. Contact a health care provider if:  Your symptoms do not go away after treatment.  Your symptoms come back after treatment.  You are unable to pop your ears.  You have: ? A fever. ? Pain in your ear. ? Pain in your head or neck. ? Fluid draining from your ear.  Your hearing suddenly changes.  You become very dizzy.  You lose your balance. This information is not intended to replace advice given to  you by your health care provider. Make sure you discuss any questions you have with your health care provider. Document Released: 09/04/2015 Document Revised: 01/14/2016 Document Reviewed: 08/27/2014 Elsevier Interactive Patient Education  [employer].

## 2018-08-29 DIAGNOSIS — J3089 Other allergic rhinitis: Secondary | ICD-10-CM | POA: Insufficient documentation

## 2019-09-30 ENCOUNTER — Ambulatory Visit (INDEPENDENT_AMBULATORY_CARE_PROVIDER_SITE_OTHER): Payer: 59 | Admitting: Podiatry

## 2019-09-30 ENCOUNTER — Encounter: Payer: Self-pay | Admitting: Podiatry

## 2019-09-30 ENCOUNTER — Other Ambulatory Visit: Payer: Self-pay

## 2019-09-30 VITALS — BP 117/76 | HR 68 | Temp 97.3°F | Resp 16

## 2019-09-30 DIAGNOSIS — B079 Viral wart, unspecified: Secondary | ICD-10-CM | POA: Diagnosis not present

## 2019-09-30 MED ORDER — TERBINAFINE HCL 250 MG PO TABS: 250.0000 mg | ORAL_TABLET | Freq: Every day | ORAL | 0 refills | Status: DC

## 2019-09-30 NOTE — Progress Notes (Signed)
   Subjective:    Patient ID: Deborah Owens, female    DOB: [birth year], 34 y.o.   MRN: [id number]  HPI    Review of Systems  All other systems reviewed and are negative.      Objective:   Physical Exam        Assessment & Plan:

## 2019-09-30 NOTE — Progress Notes (Signed)
Subjective:   Patient ID: Deborah Owens, female   DOB: 35 y.o.   MRN: [id number]   HPI Patient presents stating that she has had a spot on the bottom of her right foot that is been very sore and its been hurting her at least 6 months and she thinks it is grown in size.  States she does not remember stepping on anything and its been on the heel.  Patient does not smoke and would like to be more active   Review of Systems  All other systems reviewed and are negative.       Objective:  Physical Exam Vitals and nursing note reviewed.  Constitutional:      Appearance: She is well-developed.  Pulmonary:     Effort: Pulmonary effort is normal.  Musculoskeletal:        General: Normal range of motion.  Skin:    General: Skin is warm.  Neurological:     Mental Status: She is alert.     Neurovascular status intact muscle strength adequate range of motion within normal limits.  Patient is found to have keratotic lesions of the right heel that upon debridement shows pinpoint bleeding pain to lateral pressure with no other pathology surrounding it.  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  Patient wants this done and I did a sterile prep of the area I then went ahead and I excised the mass entirely measuring about 1.2 cm x 8 mm and I did take it to the dermal epidermal junction and it did show characteristics of verruca plate.  I did send to pathology applied sterile dressing and reappoint for recheck

## 2020-01-14 ENCOUNTER — Other Ambulatory Visit: Payer: Self-pay

## 2020-01-14 ENCOUNTER — Encounter: Payer: Self-pay | Admitting: Allergy and Immunology

## 2020-01-14 ENCOUNTER — Ambulatory Visit (INDEPENDENT_AMBULATORY_CARE_PROVIDER_SITE_OTHER): Payer: 59 | Admitting: Allergy and Immunology

## 2020-01-14 VITALS — BP 128/86 | HR 127 | Temp 98.2°F | Resp 16 | Ht 65.51 in | Wt 137.0 lb

## 2020-01-14 DIAGNOSIS — L2089 Other atopic dermatitis: Secondary | ICD-10-CM | POA: Diagnosis not present

## 2020-01-14 DIAGNOSIS — J3089 Other allergic rhinitis: Secondary | ICD-10-CM

## 2020-01-14 DIAGNOSIS — H1013 Acute atopic conjunctivitis, bilateral: Secondary | ICD-10-CM | POA: Diagnosis not present

## 2020-01-14 DIAGNOSIS — T781XXA Other adverse food reactions, not elsewhere classified, initial encounter: Secondary | ICD-10-CM

## 2020-01-14 DIAGNOSIS — J302 Other seasonal allergic rhinitis: Secondary | ICD-10-CM | POA: Insufficient documentation

## 2020-01-14 DIAGNOSIS — H1010 Acute atopic conjunctivitis, unspecified eye: Secondary | ICD-10-CM | POA: Insufficient documentation

## 2020-01-14 HISTORY — DX: Other atopic dermatitis: L20.89

## 2020-01-14 MED ORDER — OLOPATADINE HCL 0.1 % OP SOLN
1.0000 [drp] | Freq: Every day | OPHTHALMIC | 5 refills | Status: DC | PRN
Start: 2020-01-14 — End: 2020-04-16

## 2020-01-14 MED ORDER — LEVOCETIRIZINE DIHYDROCHLORIDE 5 MG PO TABS
5.0000 mg | ORAL_TABLET | Freq: Every day | ORAL | 5 refills | Status: DC | PRN
Start: 2020-01-14 — End: 2021-01-28

## 2020-01-14 MED ORDER — DESONIDE 0.05 % EX OINT: TOPICAL_OINTMENT | CUTANEOUS | 5 refills | Status: DC

## 2020-01-14 MED ORDER — XHANCE 93 MCG/ACT NA EXHU: 2.0000 | INHALANT_SUSPENSION | Freq: Two times a day (BID) | NASAL | 2 refills | Status: DC | PRN

## 2020-01-14 NOTE — Assessment & Plan Note (Addendum)
   Aeroallergen avoidance measures have been discussed and provided in written form.  A prescription has been provided for levocetirizine(Xyzal), 5 mg daily as needed.  To avoid diminishing benefit with daily use (tachyphylaxis) of second generation antihistamine, consider alternating every few months between fexofenadine (Allegra) and levocetirizine (Xyzal).  A prescription has been provided for Xhance, 2 actuations per nostril twice a day as needed. Proper technique has been discussed and demonstrated.  Nasal saline spray (i.e., Simply Saline) or nasal saline lavage (i.e., NeilMed) is recommended as needed and prior to medicated nasal sprays.  The risks and benefits of aeroallergen immunotherapy have been discussed. The patient is motivated to initiate immunotherapy if insurance coverage is favorable. She will let us know how she would like to proceed. 

## 2020-01-14 NOTE — Assessment & Plan Note (Signed)
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 Peeling or cooking the food has shown to reduce symptoms and antihistamines may also relieve symptoms. Immunotherapy to the cross reacting pollens has improved or cured PFAS in many patients, though this has not been consistent for all patients. Typically PFAS is limited to itching or swelling of mucosal tissues from the lips to the back of the throat.   Information about PFAS has been discussed and provided in written form.  All foods causing symptoms are to be avoided.  Should symptoms progress beyond the mouth and throat, epinephrine is to be administered and 911 is to be called immediately.  A prescription has been provided for epinephrine 0.3 mg autoinjector (AuviQ) 2 pack along with instructions for its proper administration. 

## 2020-01-14 NOTE — Assessment & Plan Note (Signed)
   Appropriate skin care recommendations have been provided verbally and in written form.  A prescription has been provided for desonide 0.05% ointment sparingly to affected areas twice daily as needed to the face and/or neck. Care is to be taken to avoid the eyes.  A prescription has been provided for mometasone 0.1% ointment sparingly to affected areas daily as needed below the face and neck. Care is to be taken to avoid the axillae and groin area.  The patient's mother has been asked to make note of any foods that trigger symptom flares.  Fingernails are to be kept trimmed.  Information has been provided regarding CLn BodyWash to reduce staph aureus colonization.  CLn BodyWash is ordered online.

## 2020-01-14 NOTE — Progress Notes (Signed)
New Patient Note  RE: Deborah Owens MRN: [id number] DOB: 19-Feb-1986 Date of Office Visit: 01/14/2020  Referring provider: No ref. provider found Primary care provider: Marcelle Overlie, MD  Chief Complaint: Allergic Rhinitis , Conjunctivitis, and Food Intolerance  History of present illness: Deborah Owens is a 34 y.o. female presenting today for evaluation of rhinoconjunctivitis.  She reports that she has had "bad allergies" since childhood.  Her symptoms include frequent and severe nasal congestion, rhinorrhea, sneezing, postnasal drainage, nasal pruritus, ocular pruritus, and sinus pressure between the eyes and under the eyes.  Her symptoms occur year-round but are most frequent and severe during the pollen seasons.  She owns horses and enjoys riding the horses, however her writing has been limited because of nasal, sinus, and ocular allergy symptom exacerbation when she is on and around the horses.  She reports that she has "tried everything", including over-the-counter antihistamines, nasal sprays, and eyedrops without adequate symptom relief. She reports that she avoids fresh/uncooked fruits because they cause lower pharyngeal pruritus and, occasionally, small hives around the mouth.  She does not experience concomitant angioedema, cardiopulmonary symptoms, or other GI symptoms.  She does not experience the symptoms if she consumes cooked fruits. She reports that she has had eczema since childhood with involvement of the hands, forearms, antecubital fossae, popliteal fossae, and face.  She has a prescription for triamcinolone 0.1% cream which has not provided adequate symptom relief. She has no history of symptoms consistent with asthma.  Assessment and plan: Seasonal and perennial allergic rhinitis  Aeroallergen avoidance measures have been discussed and provided in written form.  A prescription has been provided for levocetirizine (Xyzal), 5 mg daily as needed.  To avoid diminishing  benefit with daily use (tachyphylaxis) of second generation antihistamine, consider alternating every few months between fexofenadine (Allegra) and levocetirizine (Xyzal).  A prescription has been provided for [facility], 2 actuations per nostril twice a day as needed. Proper technique has been discussed and demonstrated.  Nasal saline spray (i.e., Simply Saline) or nasal saline lavage (i.e., NeilMed) is recommended as needed and prior to medicated nasal sprays.  The risks and benefits of aeroallergen immunotherapy have been discussed. The patient is motivated to initiate immunotherapy if insurance coverage is favorable. She will let us know how she would like to proceed.  Allergic conjunctivitis  Treatment plan as outlined above for allergic rhinitis.  A prescription has been provided for generic Pataday, one drop per eye daily as needed.  If insurance does not cover this medication, it may be purchased over-the-counter as IT trainer.  I have also recommended eye lubricant drops (i.e., Natural Tears) as needed.  Atopic dermatitis  Appropriate skin care recommendations have been provided verbally and in written form.  A prescription has been provided for desonide 0.05% ointment sparingly to affected areas twice daily as needed to the face and/or neck. Care is to be taken to avoid the eyes.  A prescription has been provided for mometasone 0.1% ointment sparingly to affected areas daily as needed below the face and neck. Care is to be taken to avoid the axillae and groin area.  The patient's mother has been asked to make note of any foods that trigger symptom flares.  Fingernails are to be kept trimmed.  Information has been provided regarding CLn BodyWash to reduce staph aureus colonization.  CLn BodyWash is ordered online.  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 Peeling  or cooking the  food has shown to reduce symptoms and antihistamines may also relieve symptoms. Immunotherapy to the cross reacting pollens has improved or cured PFAS in many patients, though this has not been consistent for all patients. Typically PFAS is limited to itching or swelling of mucosal tissues from the lips to the back of the throat.  Information about PFAS has been discussed and provided in written form.  All foods causing symptoms are to be avoided.  Should symptoms progress beyond the mouth and throat, epinephrine is to be administered and 911 is to be called immediately.  A prescription has been provided for epinephrine 0.3 mg autoinjector (AuviQ) 2 pack along with instructions for its proper administration.   Meds ordered this encounter  Medications  . levocetirizine (XYZAL) 5 MG tablet    Sig: Take 1 tablet (5 mg total) by mouth daily as needed for allergies.    Dispense:  30 tablet    Refill:  5  . DISCONTD: Fluticasone Propionate (XHANCE) 93 MCG/ACT EXHU    Sig: Place 2 sprays into the nose 2 (two) times daily as needed.    Dispense:  16 mL    Refill:  2  . olopatadine (PATANOL) 0.1 % ophthalmic solution    Sig: Place 1 drop into both eyes daily as needed for allergies.    Dispense:  5 mL    Refill:  5  . desonide (DESOWEN) 0.05 % ointment    Sig: Apply ointment sparingly to affected areas twice daily as needed to the face and/or neck.    Dispense:  15 g    Refill:  5    Diagnostics: Epicutaneous testing: Positive to grass pollen, weed pollen, ragweed pollen, tree pollen, cat hair, cockroach antigen, and dust mite antigen. Intradermal testing: Positive to dog epithelia.   Physical examination: Blood pressure 128/86, pulse (!) 127, temperature 98.2 F (36.8 C), temperature source Temporal, resp. rate 16, height 5' 5.51" (1.664 m), weight 137 lb (62.1 kg), SpO2 100 %.  General: Alert, interactive, in no acute distress. HEENT: TMs pearly gray, turbinates moderately edematous  with thick discharge, post-pharynx erythematous. Neck: Supple without lymphadenopathy. Lungs: Clear to auscultation without wheezing, rhonchi or rales. CV: Normal S1, S2 without murmurs. Abdomen: Nondistended, nontender. Skin: Warm and dry, without lesions or rashes. Extremities:  No clubbing, cyanosis or edema. Neuro:   Grossly intact.  Review of systems:  Review of systems negative except as noted in HPI / PMHx or noted below: Review of Systems  Constitutional: Negative.   HENT: Negative.   Eyes: Negative.   Respiratory: Negative.   Cardiovascular: Negative.   Gastrointestinal: Negative.   Genitourinary: Negative.   Musculoskeletal: Negative.   Skin: Negative.   Neurological: Negative.   Endo/Heme/Allergies: Negative.   Psychiatric/Behavioral: Negative.     Past medical history:  Past Medical History:  Diagnosis Date  . Allergy   . Atopic dermatitis 01/14/2020  . Heart murmur     Past surgical history:  Past Surgical History:  Procedure Laterality Date  . KNEE SURGERY      Family history: Family History  Problem Relation Age of Onset  . Allergic rhinitis Mother   . Eczema Neg Hx   . Urticaria Neg Hx   . Asthma Neg Hx     Social history: Social History   Socioeconomic History  . Marital status: Single    Spouse name: Not on file  . Number of children: Not on file  . Years of education: Not on  file  . Highest education level: Not on file  Occupational History  . Occupation: Waitress    Employer: [employer]   Tobacco Use  . Smoking status: Current Every Day Smoker    Types: Cigarettes  . Smokeless tobacco: Never Used  Substance and Sexual Activity  . Alcohol use: Yes    Comment: Socially  . Drug use: No  . Sexual activity: Not on file  Other Topics Concern  . Not on file  Social History Narrative  . Not on file   Social Determinants of Health   Financial Resource Strain:   . Difficulty of Paying Living Expenses:   Food Insecurity:   .  Worried About Programme researcher, broadcasting/film/video in the Last Year:   . Barista in the Last Year:   Transportation Needs:   . Freight forwarder (Medical):   Marland Kitchen Lack of Transportation (Non-Medical):   Physical Activity:   . Days of Exercise per Week:   . Minutes of Exercise per Session:   Stress:   . Feeling of Stress :   Social Connections:   . Frequency of Communication with Friends and Family:   . Frequency of Social Gatherings with Friends and Family:   . Attends Religious Services:   . Active Member of Clubs or Organizations:   . Attends Banker Meetings:   Marland Kitchen Marital Status:   Intimate Partner Violence:   . Fear of Current or Ex-Partner:   . Emotionally Abused:   Marland Kitchen Physically Abused:   . Sexually Abused:     Environmental History: The patient lives in an apartment with carpeting in the bedroom and central air/heat.  There is no known mold/water damage in the home.  There are no pets in the home.  She is a cigarette smoker and currently smokes between 4 and 6 cigarettes/day.  Current Outpatient Medications  Medication Sig Dispense Refill  . betamethasone dipropionate 0.05 % cream Apply  a small amount to affected area twice a day    . hydrOXYzine (ATARAX/VISTARIL) 10 MG tablet SMARTSIG:1-3 Tablet(s) By Mouth PRN    . levocetirizine (XYZAL) 5 MG tablet Take 5 mg by mouth daily.    Marland Kitchen triamcinolone (KENALOG) 0.025 % cream Apply twice daily for 2-3 weeks as needed for flares    . triamcinolone cream (KENALOG) 0.1 % USE ONCE OR TWICE DAILY AS DIRECTED FOR ECZEMA    . cetirizine (ZYRTEC) 10 MG tablet Take 1 tablet (10 mg total) by mouth daily. 30 tablet 11  . desonide (DESOWEN) 0.05 % ointment Apply ointment sparingly to affected areas twice daily as needed to the face and/or neck. 15 g 5  . fluticasone (FLONASE) 50 MCG/ACT nasal spray Place 2 sprays into both nostrils daily for 10 days. 16 g 0  . Fluticasone Propionate (XHANCE) 93 MCG/ACT EXHU Place 2 sprays into the nose 2  (two) times daily as needed. 32 mL 5  . levocetirizine (XYZAL) 5 MG tablet Take 1 tablet (5 mg total) by mouth daily as needed for allergies. 30 tablet 5  . mometasone (ELOCON) 0.1 % ointment Apply daily as needed to the affected areas below face and neck. Avoid underarms and groin. 45 g 5  . olopatadine (PATANOL) 0.1 % ophthalmic solution Place 1 drop into both eyes daily as needed for allergies. 5 mL 5   No current facility-administered medications for this visit.    Known medication allergies: Allergies  Allergen Reactions  . Amoxicillin Nausea And Vomiting  I appreciate the opportunity to take part in [facility] care. Please do not hesitate to contact me with questions.  Sincerely,   R. Edgar Frisk, MD

## 2020-01-14 NOTE — Patient Instructions (Addendum)
Seasonal and perennial allergic rhinitis  Aeroallergen avoidance measures have been discussed and provided in written form.  A prescription has been provided for levocetirizine (Xyzal), 5 mg daily as needed.  To avoid diminishing benefit with daily use (tachyphylaxis) of second generation antihistamine, consider alternating every few months between fexofenadine (Allegra) and levocetirizine (Xyzal).  A prescription has been provided for [facility], 2 actuations per nostril twice a day as needed. Proper technique has been discussed and demonstrated.  Nasal saline spray (i.e., Simply Saline) or nasal saline lavage (i.e., NeilMed) is recommended as needed and prior to medicated nasal sprays.  The risks and benefits of aeroallergen immunotherapy have been discussed. The patient is motivated to initiate immunotherapy if insurance coverage is favorable. She will let us know how she would like to proceed.  Allergic conjunctivitis  Treatment plan as outlined above for allergic rhinitis.  A prescription has been provided for generic Pataday, one drop per eye daily as needed.  If insurance does not cover this medication, it may be purchased over-the-counter as Lawyer.  I have also recommended eye lubricant drops (i.e., Natural Tears) as needed.  Atopic dermatitis  Appropriate skin care recommendations have been provided verbally and in written form.  A prescription has been provided for desonide 0.05% ointment sparingly to affected areas twice daily as needed to the face and/or neck. Care is to be taken to avoid the eyes.  A prescription has been provided for mometasone 0.1% ointment sparingly to affected areas daily as needed below the face and neck. Care is to be taken to avoid the axillae and groin area.  The patient's mother has been asked to make note of any foods that trigger symptom flares.  Fingernails are to be kept trimmed.  Information has been provided regarding CLn  BodyWash to reduce staph aureus colonization.  CLn BodyWash is ordered online.  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 Peeling or cooking the food has shown to reduce symptoms and antihistamines may also relieve symptoms. Immunotherapy to the cross reacting pollens has improved or cured PFAS in many patients, though this has not been consistent for all patients. Typically PFAS is limited to itching or swelling of mucosal tissues from the lips to the back of the throat.  Information about PFAS has been discussed and provided in written form.  All foods causing symptoms are to be avoided.  Should symptoms progress beyond the mouth and throat, epinephrine is to be administered and 911 is to be called immediately.  A prescription has been provided for epinephrine 0.3 mg autoinjector (AuviQ) 2 pack along with instructions for its proper administration.   Return in about 3 months (around 04/15/2020), or if symptoms worsen or fail to improve.  Control of Dust Mite Allergen  House dust mites play a major role in allergic asthma and rhinitis.  They occur in environments with high humidity wherever human skin, the food for dust mites is found. High levels have been detected in dust obtained from mattresses, pillows, carpets, upholstered furniture, bed covers, clothes and soft toys.  The principal allergen of the house dust mite is found in its feces.  A gram of dust may contain 1,000 mites and 250,000 fecal particles.  Mite antigen is easily measured in the air during house cleaning activities.    1. Encase mattresses, including the box spring, and pillow, in an air tight cover.  Seal the zipper end of the  encased mattresses with wide adhesive tape. 2. Wash the bedding in water of 130 degrees Farenheit weekly.  Avoid cotton comforters/quilts and flannel bedding: the most ideal bed covering is the dacron  comforter. 3. Remove all upholstered furniture from the bedroom. 4. Remove carpets, carpet padding, rugs, and non-washable window drapes from the bedroom.  Wash drapes weekly or use plastic window coverings. 5. Remove all non-washable stuffed toys from the bedroom.  Wash stuffed toys weekly. 6. Have the room cleaned frequently with a vacuum cleaner and a damp dust-mop.  The patient should not be in a room which is being cleaned and should wait 1 hour after cleaning before going into the room. 7. Close and seal all heating outlets in the bedroom.  Otherwise, the room will become filled with dust-laden air.  An electric heater can be used to heat the room. Reduce indoor humidity to less than 50%.  Do not use a humidifier.   Reducing Pollen Exposure  The American Academy of Allergy, Asthma and Immunology suggests the following steps to reduce your exposure to pollen during allergy seasons.    1. Do not hang sheets or clothing out to dry; pollen may collect on these items. 2. Do not mow lawns or spend time around freshly cut grass; mowing stirs up pollen. 3. Keep windows closed at night.  Keep car windows closed while driving. 4. Minimize morning activities outdoors, a time when pollen counts are usually at their highest. 5. Stay indoors as much as possible when pollen counts or humidity is high and on windy days when pollen tends to remain in the air longer. 6. Use air conditioning when possible.  Many air conditioners have filters that trap the pollen spores. 7. Use a HEPA room air filter to remove pollen form the indoor air you breathe.   Control of Dog or Cat Allergen  Avoidance is the best way to manage a dog or cat allergy. If you have a dog or cat and are allergic to dog or cats, consider removing the dog or cat from the home. If you have a dog or cat but don't want to find it a new home, or if your family wants a pet even though someone in the household is allergic, here are some  strategies that may help keep symptoms at bay:  1. Keep the pet out of your bedroom and restrict it to only a few rooms. Be advised that keeping the dog or cat in only one room will not limit the allergens to that room. 2. Don't pet, hug or kiss the dog or cat; if you do, wash your hands with soap and water. 3. High-efficiency particulate air (HEPA) cleaners run continuously in a bedroom or living room can reduce allergen levels over time. 4. Place electrostatic material sheet in the air inlet vent in the bedroom. 5. Regular use of a high-efficiency vacuum cleaner or a central vacuum can reduce allergen levels. 6. Giving your dog or cat a bath at least once a week can reduce airborne allergen.   Control of Cockroach Allergen  Cockroach allergen has been identified as an important cause of acute attacks of asthma, especially in urban settings.  There are fifty-five species of cockroach that exist in the [place], however only three, the [place], [place] species produce allergen that can affect patients with Asthma.  Allergens can be obtained from fecal particles, egg casings and secretions from cockroaches.    1. Remove food sources. 2. Reduce access  to water. 3. Seal access and entry points. 4. Spray runways with 0.5-1% Diazinon or Chlorpyrifos 5. Blow boric acid power under stoves and refrigerator. 6. Place bait stations (hydramethylnon) at feeding sites.   ECZEMA SKIN CARE REGIMEN:  Bathe and soak for 10 minutes in warm water once today. Pat dry.  Immediately apply the below emollients: To healthy skin apply Aquaphor or Vaseline jelly twice a day. To affected areas on the face and neck, apply: . Desonide 0.05% ointment twice a day as needed. . Be careful to avoid the eyes. To affected areas on the body (below the face and neck), apply: . Mometasone 0.1% ointment once a day as needed. . With ointments be careful to avoid the armpits and groin area. Note of any foods  make the eczema worse. Keep finger nails trimmed and filed.  CLn BodyWash may be ordered online at [email or web address]  Oral Allergy Syndrome (OAS) or Pollen Food Allergy Syndrome (PFAS)  Oral Allergy Syndrome (OAS) is an allergic reaction to certain (usually fresh) fruits, nuts, and vegetables. The allergy is not actually an allergy to food but a syndrome that develops in pollen allergy sufferers. The immune system mistakes the food proteins for the pollen proteins and causes an allergic reaction. For instance, an allergy to ragweed is associated with OAS reactions to banana, watermelon, cantaloupe, honeydew, zucchini, and cucumber. This does not mean that all sufferers of an allergy to ragweed will experience adverse effects from all or even any of these foods. Reactions may begin with one type of food and with reactions to others developing later. However, reaction to one or more foods in any given category does not necessarily mean a person is allergic to all foods in that group. OAS sufferers may have a number of reactions that usually occur very rapidly, within minutes of eating a trigger food. The most common reaction is an itching or burning sensation in the lips, mouth, and/or pharynx. Sometimes other reactions can be triggered in the eyes, nose, and skin. The most severe reactions can result in asthma problems or anaphylaxis.  If a sufferer is able to swallow the food, there is a good chance that there will be a reaction later in the gastrointestinal tract. Vomiting, diarrhea, severe indigestion, or cramps may occur.  Treatment: An OAS sufferer should avoid foods to which they are allergic. Peeling or cooking the food has shown to reduce symptoms in the throat and mouth, but may not relieve symptoms in the gastrointestinal tract. Antihistamines may also relieve the symptoms of the allergy. Persons with severe reactions may consider carrying injectable epinephrine should systemic symptoms occur.  Allergy immunotherapy to the pollens has improved or cured OAS in many patients, though this has not been consistent for all patients. Laban Emperor pollen: almonds, apples, celery, cherries, hazel nuts, peaches, pears, parsley, strawberry, raspberry . Birch pollen: almonds, apples, apricots, avocados, bananas, carrots, celery, cherries, chicory, coriander, fennel, fig, hazel nuts, kiwifruit, nectarines, parsley, parsnips, peaches, pears, peppers, plums, potatoes, prunes, soy, strawberries, wheat; Potential: walnuts . Grass pollen: fig, melons, tomatoes, oranges . Mugwort pollen : carrots, celery, coriander, fennel, parsley, peppers, sunflower . Ragweed pollen : banana, cantaloupe, cucumber, green pepper, paprika, sunflower seeds/oil, honeydew, watermelon, zucchini, echinacea, artichoke, dandelions, honey (if bees pollinate from wild flowers), hibiscus or chamomile tea . Possible cross-reactions (to any of the above): berries (strawberries, blueberries, raspberries, etc), citrus (oranges, lemons, etc), grapes, mango, figs, peanut, pineapple, pomegranates, watermelon

## 2020-01-14 NOTE — Assessment & Plan Note (Signed)
   Treatment plan as outlined above for allergic rhinitis.  A prescription has been provided for generic Pataday, one drop per eye daily as needed.  If insurance does not cover this medication, it may be purchased over-the-counter as Pataday Extra Strength.  I have also recommended eye lubricant drops (i.e., Natural Tears) as needed. 

## 2020-01-15 ENCOUNTER — Telehealth: Payer: Self-pay | Admitting: Allergy and Immunology

## 2020-01-15 ENCOUNTER — Other Ambulatory Visit: Payer: Self-pay | Admitting: *Deleted

## 2020-01-15 ENCOUNTER — Encounter: Payer: Self-pay | Admitting: Allergy and Immunology

## 2020-01-15 DIAGNOSIS — J301 Allergic rhinitis due to pollen: Secondary | ICD-10-CM | POA: Diagnosis not present

## 2020-01-15 MED ORDER — MOMETASONE FUROATE 0.1 % EX OINT: TOPICAL_OINTMENT | CUTANEOUS | 5 refills | Status: DC

## 2020-01-15 MED ORDER — XHANCE 93 MCG/ACT NA EXHU: 2.0000 | INHALANT_SUSPENSION | Freq: Two times a day (BID) | NASAL | 5 refills | Status: DC | PRN

## 2020-01-15 NOTE — Telephone Encounter (Signed)
Patient called and said that the Mometasone did not get called in yesterday. [facility] [address]. [phone].

## 2020-01-15 NOTE — Progress Notes (Signed)
VIALS EXP 01-14-21 

## 2020-01-15 NOTE — Telephone Encounter (Signed)
Sent in medication to pharmacy. Called patient and advised. Patient verbalized understanding.

## 2020-01-16 DIAGNOSIS — J3089 Other allergic rhinitis: Secondary | ICD-10-CM | POA: Diagnosis not present

## 2020-02-04 ENCOUNTER — Ambulatory Visit (INDEPENDENT_AMBULATORY_CARE_PROVIDER_SITE_OTHER): Payer: 59

## 2020-02-04 ENCOUNTER — Other Ambulatory Visit: Payer: Self-pay

## 2020-02-04 DIAGNOSIS — J309 Allergic rhinitis, unspecified: Secondary | ICD-10-CM | POA: Diagnosis not present

## 2020-02-04 MED ORDER — EPINEPHRINE 0.3 MG/0.3ML IJ SOAJ
0.3000 mg | Freq: Once | INTRAMUSCULAR | 1 refills | Status: DC | PRN
Start: 2020-02-04 — End: 2021-01-28

## 2020-02-04 NOTE — Progress Notes (Signed)
Immunotherapy   Patient Details  Name: Deborah Owens MRN: [id number] Date of Birth: [birth year]  02/04/2020  Deborah Owens started her allergy injections today. Patient received 0.05 of both her silver vials with an expiration of 01/14/2021. One with G-W-T and the other with RW-DM-C-D-Horse-CR. Patient waited 30 minutes in room with no problems. Following schedule: A Frequency: 1-2 times weekly Epi-Pen: sent one in. Consent signed and patient instructions given.   Dub Mikes 02/04/2020, 10:48 AM

## 2020-02-06 ENCOUNTER — Ambulatory Visit: Payer: 59 | Admitting: Allergy

## 2020-02-06 NOTE — Progress Notes (Deleted)
Follow Up Note  RE: Deborah Owens MRN: [id number] DOB: [birth year] Date of Office Visit: 02/06/2020  Referring provider: Marcelle Overlie, MD Primary care provider: Marcelle Overlie, MD  Chief Complaint: No chief complaint on file.  History of Present Illness: I had the pleasure of seeing Deborah Owens for a follow up visit at the [facility] on 02/06/2020. She is a 34 y.o. female, who is being followed for allergic rhinoconjunctivitis, atopic dermatitis and oral allergy syndrome. Her previous allergy office visit was on 01/14/2020 with Dr. Nunzio Cobbs. Today is a new complaint visit of Issues with her breathing.  Deborah Owens started her allergy injections today. Patient received 0.05 of both her silver vials with an expiration of 01/14/2021. One with G-W-T and the other with RW-DM-C-D-Horse-CR. Patient waited 30 minutes in room with no problems. Following schedule: A Frequency: 1-2 times weekly Epi-Pen: sent one in. Consent signed and patient instructions given.  Seasonal and perennial allergic rhinitis  Aeroallergen avoidance measures have been discussed and provided in written form.  A prescription has been provided for levocetirizine (Xyzal), 5 mg daily as needed.  To avoid diminishing benefit with daily use (tachyphylaxis) of second generation antihistamine, consider alternating every few months between fexofenadine (Allegra) and levocetirizine (Xyzal).  A prescription has been provided for [facility], 2 actuations per nostril twice a day as needed. Proper technique has been discussed and demonstrated.  Nasal saline spray (i.e., Simply Saline) or nasal saline lavage (i.e., NeilMed) is recommended as needed and prior to medicated nasal sprays.  The risks and benefits of aeroallergen immunotherapy have been discussed. The patient is motivated to initiate immunotherapy if insurance coverage is favorable. She will let us know how she would like to proceed.  Allergic  conjunctivitis  Treatment plan as outlined above for allergic rhinitis.  A prescription has been provided for generic Pataday, one drop per eye daily as needed.  If insurance does not cover this medication, it may be purchased over-the-counter as IT trainer.  I have also recommended eye lubricant drops (i.e., Natural Tears) as needed.  Atopic dermatitis  Appropriate skin care recommendations have been provided verbally and in written form.  A prescription has been provided for desonide 0.05% ointment sparingly to affected areas twice daily as needed to the face and/or neck. Care is to be taken to avoid the eyes.  A prescription has been provided for mometasone 0.1% ointment sparingly to affected areas daily as needed below the face and neck. Care is to be taken to avoid the axillae and groin area.  The patient's mother has been asked to make note of any foods that trigger symptom flares.  Fingernails are to be kept trimmed.  Information has been provided regarding CLn BodyWash to reduce staph aureus colonization.  CLn BodyWash is ordered online.  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 Peeling or cooking the food has shown to reduce symptoms and antihistamines may also relieve symptoms. Immunotherapy to the cross reacting pollens has improved or cured PFAS in many patients, though this has not been consistent for all patients. Typically PFAS is limited to itching or swelling of mucosal tissues from the lips to the back of the throat.  Information about PFAS has been discussed and provided in written form.  All foods causing symptoms are to be avoided.  Should symptoms progress beyond the mouth and throat, epinephrine is to be administered and 911  is to be called immediately.  A prescription has been provided for epinephrine 0.3 mg autoinjector (AuviQ) 2 pack along with instructions  for its proper administration.  Assessment and Plan: Deborah Owens is a 34 y.o. female with: No problem-specific Assessment & Plan notes found for this encounter.  No follow-ups on file.  No orders of the defined types were placed in this encounter.  Lab Orders  No laboratory test(s) ordered today    Diagnostics: Spirometry:  Tracings reviewed. Her effort: {Blank single:19197::"Good reproducible efforts.","It was hard to get consistent efforts and there is a question as to whether this reflects a maximal maneuver.","Poor effort, data can not be interpreted."} FVC: ***L FEV1: ***L, ***% predicted FEV1/FVC ratio: ***% Interpretation: {Blank single:19197::"Spirometry consistent with mild obstructive disease","Spirometry consistent with moderate obstructive disease","Spirometry consistent with severe obstructive disease","Spirometry consistent with possible restrictive disease","Spirometry consistent with mixed obstructive and restrictive disease","Spirometry uninterpretable due to technique","Spirometry consistent with normal pattern","No overt abnormalities noted given today's efforts"}.  Please see scanned spirometry results for details.  Skin Testing: {Blank single:19197::"Select foods","Environmental allergy panel","Environmental allergy panel and select foods","Food allergy panel","None","Deferred due to recent antihistamines use"}. Positive test to: ***. Negative test to: ***.  Results discussed with patient/family.   Medication List:  Current Outpatient Medications  Medication Sig Dispense Refill  . betamethasone dipropionate 0.05 % cream Apply  a small amount to affected area twice a day    . cetirizine (ZYRTEC) 10 MG tablet Take 1 tablet (10 mg total) by mouth daily. 30 tablet 11  . desonide (DESOWEN) 0.05 % ointment Apply ointment sparingly to affected areas twice daily as needed to the face and/or neck. 15 g 5  . EPINEPHrine (EPIPEN 2-PAK) 0.3 mg/0.3 mL IJ SOAJ injection Inject 0.3  mLs (0.3 mg total) into the muscle once as needed for up to 1 dose for anaphylaxis. 2 each 1  . fluticasone (FLONASE) 50 MCG/ACT nasal spray Place 2 sprays into both nostrils daily for 10 days. 16 g 0  . Fluticasone Propionate (XHANCE) 93 MCG/ACT EXHU Place 2 sprays into the nose 2 (two) times daily as needed. 32 mL 5  . hydrOXYzine (ATARAX/VISTARIL) 10 MG tablet SMARTSIG:1-3 Tablet(s) By Mouth PRN    . levocetirizine (XYZAL) 5 MG tablet Take 5 mg by mouth daily.    Marland Kitchen levocetirizine (XYZAL) 5 MG tablet Take 1 tablet (5 mg total) by mouth daily as needed for allergies. 30 tablet 5  . mometasone (ELOCON) 0.1 % ointment Apply daily as needed to the affected areas below face and neck. Avoid underarms and groin. 45 g 5  . olopatadine (PATANOL) 0.1 % ophthalmic solution Place 1 drop into both eyes daily as needed for allergies. 5 mL 5  . triamcinolone (KENALOG) 0.025 % cream Apply twice daily for 2-3 weeks as needed for flares    . triamcinolone cream (KENALOG) 0.1 % USE ONCE OR TWICE DAILY AS DIRECTED FOR ECZEMA     No current facility-administered medications for this visit.   Allergies: Allergies  Allergen Reactions  . Amoxicillin Nausea And Vomiting   I reviewed her past medical history, social history, family history, and environmental history and no significant changes have been reported from her previous visit.  Review of Systems  Constitutional: Negative for appetite change, chills, fever and unexpected weight change.  HENT: Negative for congestion and rhinorrhea.   Eyes: Negative for itching.  Respiratory: Negative for cough, chest tightness, shortness of breath and wheezing.   Gastrointestinal: Negative for abdominal pain.  Skin: Negative for rash.  Allergic/Immunologic: Positive for environmental allergies.  Neurological: Negative for headaches.   Objective: There were no vitals taken for this visit. There is no height or weight on file to calculate BMI. Physical Exam Vitals  and nursing note reviewed.  Constitutional:      Appearance: Normal appearance. She is well-developed.  HENT:     Head: Normocephalic and atraumatic.     Right Ear: External ear normal.     Left Ear: External ear normal.     Nose: Nose normal.     Mouth/Throat:     Mouth: Mucous membranes are moist.     Pharynx: Oropharynx is clear.  Eyes:     Conjunctiva/sclera: Conjunctivae normal.  Cardiovascular:     Rate and Rhythm: Normal rate and regular rhythm.     Heart sounds: Normal heart sounds. No murmur heard.   Pulmonary:     Effort: Pulmonary effort is normal.     Breath sounds: Normal breath sounds. No wheezing, rhonchi or rales.  Musculoskeletal:     Cervical back: Neck supple.  Skin:    General: Skin is warm.     Findings: No rash.  Neurological:     Mental Status: She is alert and oriented to person, place, and time.  Psychiatric:        Behavior: Behavior normal.    Previous notes and tests were reviewed. The plan was reviewed with the patient/family, and all questions/concerned were addressed.  It was my pleasure to see Deborah Owens today and participate in her care. Please feel free to contact me with any questions or concerns.  Sincerely,  Rexene Alberts, DO Allergy & Immunology  [facility] office: [phone] [facility] office: [place] office: [phone]

## 2020-02-10 ENCOUNTER — Telehealth: Payer: Self-pay

## 2020-02-10 NOTE — Telephone Encounter (Signed)
Prior authorization for Deborah Owens denied by insurance. Covered alternatives are flunisolide and mometasone nasal spray. She has already tried [employer]. Please advise on alternative choice and directions for patient.

## 2020-02-13 ENCOUNTER — Other Ambulatory Visit: Payer: Self-pay

## 2020-02-13 ENCOUNTER — Ambulatory Visit (INDEPENDENT_AMBULATORY_CARE_PROVIDER_SITE_OTHER): Payer: 59

## 2020-02-13 DIAGNOSIS — J309 Allergic rhinitis, unspecified: Secondary | ICD-10-CM

## 2020-02-17 MED ORDER — MOMETASONE FUROATE 50 MCG/ACT NA SUSP
2.0000 | Freq: Every day | NASAL | 5 refills | Status: DC | PRN
Start: 2020-02-17 — End: 2020-04-16

## 2020-02-17 NOTE — Telephone Encounter (Signed)
Mometasone nasal spray, 2 sprays per nostril daily as needed. Thanks. 

## 2020-02-17 NOTE — Addendum Note (Signed)
Addended by: Mliss Fritz I on: 02/17/2020 11:15 AM   Modules accepted: Orders

## 2020-02-17 NOTE — Telephone Encounter (Signed)
Prescription has been sent in and patient informed of medication change.

## 2020-02-18 ENCOUNTER — Other Ambulatory Visit: Payer: Self-pay

## 2020-02-18 ENCOUNTER — Ambulatory Visit (INDEPENDENT_AMBULATORY_CARE_PROVIDER_SITE_OTHER): Payer: 59

## 2020-02-18 DIAGNOSIS — J309 Allergic rhinitis, unspecified: Secondary | ICD-10-CM

## 2020-02-20 ENCOUNTER — Other Ambulatory Visit: Payer: Self-pay

## 2020-02-20 ENCOUNTER — Ambulatory Visit (INDEPENDENT_AMBULATORY_CARE_PROVIDER_SITE_OTHER): Payer: 59

## 2020-02-20 DIAGNOSIS — J309 Allergic rhinitis, unspecified: Secondary | ICD-10-CM | POA: Diagnosis not present

## 2020-02-27 ENCOUNTER — Other Ambulatory Visit: Payer: Self-pay

## 2020-02-27 ENCOUNTER — Ambulatory Visit (INDEPENDENT_AMBULATORY_CARE_PROVIDER_SITE_OTHER): Payer: 59

## 2020-02-27 DIAGNOSIS — J309 Allergic rhinitis, unspecified: Secondary | ICD-10-CM

## 2020-03-03 ENCOUNTER — Ambulatory Visit (INDEPENDENT_AMBULATORY_CARE_PROVIDER_SITE_OTHER): Payer: 59

## 2020-03-03 ENCOUNTER — Other Ambulatory Visit: Payer: Self-pay

## 2020-03-03 DIAGNOSIS — J309 Allergic rhinitis, unspecified: Secondary | ICD-10-CM

## 2020-03-05 ENCOUNTER — Other Ambulatory Visit: Payer: Self-pay

## 2020-03-05 ENCOUNTER — Ambulatory Visit (INDEPENDENT_AMBULATORY_CARE_PROVIDER_SITE_OTHER): Payer: 59

## 2020-03-05 DIAGNOSIS — J309 Allergic rhinitis, unspecified: Secondary | ICD-10-CM

## 2020-03-17 ENCOUNTER — Other Ambulatory Visit: Payer: Self-pay

## 2020-03-17 ENCOUNTER — Ambulatory Visit (INDEPENDENT_AMBULATORY_CARE_PROVIDER_SITE_OTHER): Payer: 59

## 2020-03-17 DIAGNOSIS — J309 Allergic rhinitis, unspecified: Secondary | ICD-10-CM

## 2020-03-24 ENCOUNTER — Ambulatory Visit (INDEPENDENT_AMBULATORY_CARE_PROVIDER_SITE_OTHER): Payer: 59

## 2020-03-24 ENCOUNTER — Other Ambulatory Visit: Payer: Self-pay

## 2020-03-24 DIAGNOSIS — J309 Allergic rhinitis, unspecified: Secondary | ICD-10-CM

## 2020-04-02 ENCOUNTER — Other Ambulatory Visit: Payer: Self-pay

## 2020-04-02 ENCOUNTER — Ambulatory Visit (INDEPENDENT_AMBULATORY_CARE_PROVIDER_SITE_OTHER): Payer: 59

## 2020-04-02 DIAGNOSIS — J309 Allergic rhinitis, unspecified: Secondary | ICD-10-CM

## 2020-04-07 ENCOUNTER — Ambulatory Visit (INDEPENDENT_AMBULATORY_CARE_PROVIDER_SITE_OTHER): Payer: 59

## 2020-04-07 ENCOUNTER — Other Ambulatory Visit: Payer: Self-pay

## 2020-04-07 DIAGNOSIS — J309 Allergic rhinitis, unspecified: Secondary | ICD-10-CM

## 2020-04-15 NOTE — Progress Notes (Signed)
Follow Up Note  RE: [name] MRN: [id number] DOB: Apr 30, 1986 Date of Office Visit: 04/16/2020  Referring provider: Marcelle Overlie, MD Primary care provider: Marcelle Overlie, MD  Chief Complaint: Allergic Rhinitis   History of Present Illness: I had the pleasure of seeing Deborah Owens for a follow up visit at the [facility] on 04/16/2020. She is a 34 y.o. female, who is being followed for allergic rhinoconjunctivitis on AIT, atopic dermatitis and oral allergy syndrome. Her previous allergy office visit was on 01/14/2020 with Dr. Nunzio Cobbs. Today is a regular follow up visit.  Allergic rhino conjunctivitis Patient having some nasal congestion and sneezing on and off for the past week. Denies any fevers/chills.  Tolerating allergy injections with no issues.   Not on any daily antihistamines. Currently not on any nasal sprays.   Some itchy eyes but OTC eye drops too expensive.   Atopic dermatitis Using desonide and mometasone with no benefit.  Eczema usually flares on her arms, hands, face and behind the legs.  She saw dermatology for this as well.  Currently using Aquaphor products,original gain and downy.  Wants to know more about Dupixent injections.    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   Assessment and Plan: [name] is a 34 y.o. female with: Seasonal and perennial allergic rhinoconjunctivitis Past history - started AIT on 02/04/2020 (G-W-T and RW-DM-C-D-Horse-CR). Interim history - not taking any daily medications. Xhance not covered and OTC eye drops too expensive. Having some increase rhinitis symptoms the last week.   Continue allergy injections.  See below for environmental control measures.   May use over the counter antihistamines such as Xyzal (levocetirizine) daily as needed.  Take on the days of injections.  This can also help with the pruritus.  May use Flonase  (fluticasone) nasal spray 1 spray per nostril twice a day as needed for nasal congestion.   May use azelastine eye drops 1 drop in each eye twice a day as needed for itchy/watery eyes.   Atopic dermatitis Topical steroid creams not effective. Still flares on hands, upper extremities, face and legs.  Continue skin care as below - avoid fragrances.   May use desonide 0.05% ointment sparingly to affected areas twice daily as needed to the face and/or neck. Care is to be taken to avoid the eyes.  May use mometasone 0.1% ointment sparingly to affected areas daily as needed below the face and neck. Care is to be taken to avoid the axillae and groin area.  Use triamcinolone:Eucerin mix twice a day as moisturizer.  Read about Dupixent injections and will discuss in more detail at next visit if above regimen is not controlling the eczema.   Pollen-food allergy Avoiding fresh pitted fruits.  Continue to avoid fruits that are bothersome.  For mild symptoms you can take over the counter antihistamines such as Benadryl and monitor symptoms closely. If symptoms worsen or if you have severe symptoms including breathing issues, throat closure, significant swelling, whole body hives, severe diarrhea and vomiting, lightheadedness then inject epinephrine and seek immediate medical care afterwards.  Return in about 3 months (around 07/17/2020).  Meds ordered this encounter  Medications  . fluticasone (FLONASE) 50 MCG/ACT nasal spray    Sig: Place 1 spray into both nostrils in the morning and at bedtime.    Dispense:  16 g    Refill:  5  . azelastine (OPTIVAR) 0.05 % ophthalmic solution  Sig: Place 1 drop into both eyes 2 (two) times daily as needed (itchy/watery eyes).    Dispense:  6 mL    Refill:  5  . triamcinolone ointment (KENALOG) 0.1 %    Sig: Apply 1 application topically 2 (two) times daily. Use as moisturizer.    Dispense:  453.6 g    Refill:  2    Mix 1:1 with Eucerin.   Lab Orders   No laboratory test(s) ordered today    Diagnostics: None.  Medication List:  Current Outpatient Medications  Medication Sig Dispense Refill  . albuterol (VENTOLIN HFA) 108 (90 Base) MCG/ACT inhaler SMARTSIG:1.5 Inhalation Via Inhaler Every 4-6 Hours PRN    . betamethasone dipropionate 0.05 % cream Apply  a small amount to affected area twice a day    . desonide (DESOWEN) 0.05 % ointment Apply ointment sparingly to affected areas twice daily as needed to the face and/or neck. 15 g 5  . EPINEPHrine (EPIPEN 2-PAK) 0.3 mg/0.3 mL IJ SOAJ injection Inject 0.3 mLs (0.3 mg total) into the muscle once as needed for up to 1 dose for anaphylaxis. 2 each 1  . hydrOXYzine (ATARAX/VISTARIL) 10 MG tablet SMARTSIG:1-3 Tablet(s) By Mouth PRN    . levocetirizine (XYZAL) 5 MG tablet Take 1 tablet (5 mg total) by mouth daily as needed for allergies. 30 tablet 5  . methocarbamol (ROBAXIN) 500 MG tablet Take 500 mg by mouth 2 (two) times daily.    . mometasone (ELOCON) 0.1 % ointment Apply daily as needed to the affected areas below face and neck. Avoid underarms and groin. 45 g 5  . triamcinolone (KENALOG) 0.025 % cream Apply twice daily for 2-3 weeks as needed for flares    . triamcinolone cream (KENALOG) 0.1 % USE ONCE OR TWICE DAILY AS DIRECTED FOR ECZEMA    . valACYclovir (VALTREX) 500 MG tablet Take 500 mg by mouth daily.    Marland Kitchen azelastine (OPTIVAR) 0.05 % ophthalmic solution Place 1 drop into both eyes 2 (two) times daily as needed (itchy/watery eyes). 6 mL 5  . fluticasone (FLONASE) 50 MCG/ACT nasal spray Place 1 spray into both nostrils in the morning and at bedtime. 16 g 5  . triamcinolone ointment (KENALOG) 0.1 % Apply 1 application topically 2 (two) times daily. Use as moisturizer. 453.6 g 2   No current facility-administered medications for this visit.   Allergies: Allergies  Allergen Reactions  . Amoxicillin Nausea And Vomiting   I reviewed her past medical history, social history, family  history, and environmental history and no significant changes have been reported from her previous visit.  Review of Systems  Constitutional: Negative for appetite change, chills, fever and unexpected weight change.  HENT: Positive for congestion, rhinorrhea and sneezing.   Eyes: Positive for itching.  Respiratory: Negative for cough, chest tightness, shortness of breath and wheezing.   Cardiovascular: Negative for chest pain.  Gastrointestinal: Negative for abdominal pain.  Genitourinary: Negative for difficulty urinating.  Skin: Positive for rash.  Allergic/Immunologic: Positive for environmental allergies and food allergies.  Neurological: Negative for headaches.   Objective: BP 110/68   Pulse 89   Resp 18   SpO2 96%  There is no height or weight on file to calculate BMI. Physical Exam Vitals and nursing note reviewed.  Constitutional:      Appearance: Normal appearance. She is well-developed.  HENT:     Head: Normocephalic and atraumatic.     Right Ear: Tympanic membrane and external ear normal.  Left Ear: Tympanic membrane and external ear normal.     Nose: Congestion present.     Mouth/Throat:     Mouth: Mucous membranes are moist.     Pharynx: Oropharynx is clear.  Eyes:     Conjunctiva/sclera: Conjunctivae normal.  Cardiovascular:     Rate and Rhythm: Normal rate and regular rhythm.     Heart sounds: Normal heart sounds. No murmur heard.   Pulmonary:     Effort: Pulmonary effort is normal.     Breath sounds: Normal breath sounds. No wheezing, rhonchi or rales.  Musculoskeletal:     Cervical back: Neck supple.  Skin:    General: Skin is warm.     Findings: Rash present.     Comments: Dry, leathery changes on hands. Hyperkeratotic patch on right forearm area. Slight dry patch on medial right eye area.  Neurological:     Mental Status: She is alert and oriented to person, place, and time.  Psychiatric:        Behavior: Behavior normal.    Previous notes  and tests were reviewed. The plan was reviewed with the patient/family, and all questions/concerned were addressed.  It was my pleasure to see Deborah Owens today and participate in her care. Please feel free to contact me with any questions or concerns.  Sincerely,  Wyline Mood, DO Allergy & Immunology  [facility] office: [phone] [facility] office: [phone] [place] office: [phone]

## 2020-04-16 ENCOUNTER — Ambulatory Visit (INDEPENDENT_AMBULATORY_CARE_PROVIDER_SITE_OTHER): Payer: 59 | Admitting: Allergy

## 2020-04-16 ENCOUNTER — Encounter: Payer: Self-pay | Admitting: Allergy

## 2020-04-16 ENCOUNTER — Other Ambulatory Visit: Payer: Self-pay

## 2020-04-16 VITALS — BP 110/68 | HR 89 | Resp 18

## 2020-04-16 DIAGNOSIS — J309 Allergic rhinitis, unspecified: Secondary | ICD-10-CM | POA: Diagnosis not present

## 2020-04-16 DIAGNOSIS — T781XXD Other adverse food reactions, not elsewhere classified, subsequent encounter: Secondary | ICD-10-CM | POA: Diagnosis not present

## 2020-04-16 DIAGNOSIS — J302 Other seasonal allergic rhinitis: Secondary | ICD-10-CM

## 2020-04-16 DIAGNOSIS — H1010 Acute atopic conjunctivitis, unspecified eye: Secondary | ICD-10-CM

## 2020-04-16 DIAGNOSIS — L2089 Other atopic dermatitis: Secondary | ICD-10-CM | POA: Diagnosis not present

## 2020-04-16 DIAGNOSIS — J3089 Other allergic rhinitis: Secondary | ICD-10-CM

## 2020-04-16 MED ORDER — FLUTICASONE PROPIONATE 50 MCG/ACT NA SUSP
1.0000 | Freq: Two times a day (BID) | NASAL | 5 refills | Status: DC
Start: 2020-04-16 — End: 2021-01-28

## 2020-04-16 MED ORDER — TRIAMCINOLONE ACETONIDE 0.1 % EX OINT: 1.0000 "application " | TOPICAL_OINTMENT | Freq: Two times a day (BID) | CUTANEOUS | 2 refills | Status: DC

## 2020-04-16 MED ORDER — AZELASTINE HCL 0.05 % OP SOLN: 1.0000 [drp] | Freq: Two times a day (BID) | OPHTHALMIC | 5 refills | Status: DC | PRN

## 2020-04-16 NOTE — Assessment & Plan Note (Signed)
Avoiding fresh pitted fruits.  Continue to avoid fruits that are bothersome.  For mild symptoms you can take over the counter antihistamines such as Benadryl and monitor symptoms closely. If symptoms worsen or if you have severe symptoms including breathing issues, throat closure, significant swelling, whole body hives, severe diarrhea and vomiting, lightheadedness then inject epinephrine and seek immediate medical care afterwards.

## 2020-04-16 NOTE — Assessment & Plan Note (Signed)
Past history - started AIT on 02/04/2020 (G-W-T and RW-DM-C-D-Horse-CR). Interim history - not taking any daily medications. Xhance not covered and OTC eye drops too expensive. Having some increase rhinitis symptoms the last week.   Continue allergy injections.  See below for environmental control measures.   May use over the counter antihistamines such as Xyzal (levocetirizine) daily as needed.  Take on the days of injections.  This can also help with the pruritus.  May use Flonase (fluticasone) nasal spray 1 spray per nostril twice a day as needed for nasal congestion.   May use azelastine eye drops 1 drop in each eye twice a day as needed for itchy/watery eyes.

## 2020-04-16 NOTE — Patient Instructions (Addendum)
Allergic rhino conjunctivitis  Continue allergy injections.  See below for environmental control measures.   May use over the counter antihistamines such as Xyzal (levocetirizine) daily as needed.  Take on the days of your injections.  This can also help with your skin itching.   May use Flonase (fluticasone) nasal spray 1 spray per nostril twice a day as needed for nasal congestion.   May use azelastine eye drops 1 drop in each eye twice a day as needed for itchy/watery eyes.   Atopic dermatitis  Continue skin care as below.  May use desonide 0.05% ointment sparingly to affected areas twice daily as needed to the face and/or neck. Care is to be taken to avoid the eyes.  May use mometasone 0.1% ointment sparingly to affected areas daily as needed below the face and neck. Care is to be taken to avoid the axillae and groin area.  Use triamcinolone:eucerin mix twice a day as moisturizer.  Read about Dupixent injections and we can discuss in more detail at next visit.   Pollen-food allergy syndrome  Continue to avoid fruits that are bothersome.  For mild symptoms you can take over the counter antihistamines such as Benadryl and monitor symptoms closely. If symptoms worsen or if you have severe symptoms including breathing issues, throat closure, significant swelling, whole body hives, severe diarrhea and vomiting, lightheadedness then inject epinephrine and seek immediate medical care afterwards.  Follow up in 3 months or sooner if needed.  Skin care recommendations  Bath time: . Always use lukewarm water. AVOID very hot or cold water. Marland Kitchen Keep bathing time to 5-10 minutes. . Do NOT use bubble bath. . Use a mild soap and use just enough to wash the dirty areas. . Do NOT scrub skin vigorously.  . After bathing, pat dry your skin with a towel. Do NOT rub or scrub the skin.  Moisturizers and prescriptions:  . ALWAYS apply moisturizers immediately after bathing (within 3  minutes). This helps to lock-in moisture. . Use the moisturizer several times a day over the whole body. Peri Jefferson summer moisturizers include: Aveeno, CeraVe, Cetaphil. Peri [place] winter moisturizers include: Aquaphor, Vaseline, Cerave, Cetaphil, Eucerin, Vanicream. . When using moisturizers along with medications, the moisturizer should be applied about one hour after applying the medication to prevent diluting effect of the medication or moisturize around where you applied the medications. When not using medications, the moisturizer can be continued twice daily as maintenance.  Laundry and clothing: . Avoid laundry products with added color or perfumes. . Use unscented hypo-allergenic laundry products such as Tide free, Cheer free & gentle, and All free and clear.  . If the skin still seems dry or sensitive, you can try double-rinsing the clothes. . Avoid tight or scratchy clothing such as wool. . Do not use fabric softeners or dyer sheets.  Reducing Pollen Exposure . Pollen seasons: trees (spring), grass (summer) and ragweed/weeds (fall). Marland Kitchen Keep windows closed in your home and car to lower pollen exposure.  Lilian Kapur air conditioning in the bedroom and throughout the house if possible.  . Avoid going out in dry windy days - especially early morning. . Pollen counts are highest between 5 - 10 AM and on dry, hot and windy days.  . Save outside activities for late afternoon or after a heavy rain, when pollen levels are lower.  . Avoid mowing of grass if you have grass pollen allergy. Marland Kitchen Be aware that pollen can also be transported indoors on people and  pets.  . Dry your clothes in an automatic dryer rather than hanging them outside where they might collect pollen.  . Rinse hair and eyes before bedtime. Control of House Dust Mite Allergen . Dust mite allergens are a common trigger of allergy and asthma symptoms. While they can be found throughout the house, these microscopic creatures thrive in  warm, humid environments such as bedding, upholstered furniture and carpeting. . Because so much time is spent in the bedroom, it is essential to reduce mite levels there.  . Encase pillows, mattresses, and box springs in special allergen-proof fabric covers or airtight, zippered plastic covers.  . Bedding should be washed weekly in hot water (130 F) and dried in a hot dryer. Allergen-proof covers are available for comforters and pillows that can't be regularly washed.  Reyes Ivan the allergy-proof covers every few months. Minimize clutter in the bedroom. Keep pets out of the bedroom.  Marland Kitchen Keep humidity less than 50% by using a dehumidifier or air conditioning. You can buy a humidity measuring device called a hygrometer to monitor this.  . If possible, replace carpets with hardwood, linoleum, or washable area rugs. If that's not possible, vacuum frequently with a vacuum that has a HEPA filter. . Remove all upholstered furniture and non-washable window drapes from the bedroom. . Remove all non-washable stuffed toys from the bedroom.  Wash stuffed toys weekly. Pet Allergen Avoidance: . Contrary to popular opinion, there are no "hypoallergenic" breeds of dogs or cats. That is because people are not allergic to an animal's hair, but to an allergen found in the animal's saliva, dander (dead skin flakes) or urine. Pet allergy symptoms typically occur within minutes. For some people, symptoms can build up and become most severe 8 to 12 hours after contact with the animal. People with severe allergies can experience reactions in public places if dander has been transported on the pet owners' clothing. Marland Kitchen Keeping an animal outdoors is only a partial solution, since homes with pets in the yard still have higher concentrations of animal allergens. . Before getting a pet, ask your allergist to determine if you are allergic to animals. If your pet is already considered part of your family, try to minimize contact and keep  the pet out of the bedroom and other rooms where you spend a great deal of time. . As with dust mites, vacuum carpets often or replace carpet with a hardwood floor, tile or linoleum. . High-efficiency particulate air (HEPA) cleaners can reduce allergen levels over time. . While dander and saliva are the source of cat and dog allergens, urine is the source of allergens from rabbits, hamsters, mice and [place] pigs; so ask a non-allergic family member to clean the animal's cage. . If you have a pet allergy, talk to your allergist about the potential for allergy immunotherapy (allergy shots). This strategy can often provide long-term relief. Cockroach Allergen Avoidance Cockroaches are often found in the homes of densely populated urban areas, schools or commercial buildings, but these creatures can lurk almost anywhere. This does not mean that you have a dirty house or living area. . Block all areas where roaches can enter the home. This includes crevices, wall cracks and windows.  . Cockroaches need water to survive, so fix and seal all leaky faucets and pipes. Have an exterminator go through the house when your family and pets are gone to eliminate any remaining roaches. Marland Kitchen Keep food in lidded containers and put pet food dishes away after your  pets are done eating. Vacuum and sweep the floor after meals, and take out garbage and recyclables. Use lidded garbage containers in the kitchen. Wash dishes immediately after use and clean under stoves, refrigerators or toasters where crumbs can accumulate. Wipe off the stove and other kitchen surfaces and cupboards regularly.

## 2020-04-16 NOTE — Assessment & Plan Note (Signed)
Topical steroid creams not effective. Still flares on hands, upper extremities, face and legs.  Continue skin care as below - avoid fragrances.   May use desonide 0.05% ointment sparingly to affected areas twice daily as needed to the face and/or neck. Care is to be taken to avoid the eyes.  May use mometasone 0.1% ointment sparingly to affected areas daily as needed below the face and neck. Care is to be taken to avoid the axillae and groin area.  Use triamcinolone:Eucerin mix twice a day as moisturizer.  Read about Dupixent injections and will discuss in more detail at next visit if above regimen is not controlling the eczema.

## 2020-04-21 ENCOUNTER — Ambulatory Visit (INDEPENDENT_AMBULATORY_CARE_PROVIDER_SITE_OTHER): Payer: 59

## 2020-04-21 ENCOUNTER — Other Ambulatory Visit: Payer: Self-pay

## 2020-04-21 DIAGNOSIS — J309 Allergic rhinitis, unspecified: Secondary | ICD-10-CM | POA: Diagnosis not present

## 2020-04-23 ENCOUNTER — Ambulatory Visit (INDEPENDENT_AMBULATORY_CARE_PROVIDER_SITE_OTHER): Payer: 59

## 2020-04-23 ENCOUNTER — Other Ambulatory Visit: Payer: Self-pay

## 2020-04-23 DIAGNOSIS — J309 Allergic rhinitis, unspecified: Secondary | ICD-10-CM | POA: Diagnosis not present

## 2020-06-04 ENCOUNTER — Ambulatory Visit: Payer: Self-pay

## 2020-06-25 ENCOUNTER — Other Ambulatory Visit: Payer: Self-pay

## 2020-06-25 ENCOUNTER — Ambulatory Visit (INDEPENDENT_AMBULATORY_CARE_PROVIDER_SITE_OTHER): Payer: 59

## 2020-06-25 DIAGNOSIS — J309 Allergic rhinitis, unspecified: Secondary | ICD-10-CM | POA: Diagnosis not present

## 2020-07-21 ENCOUNTER — Ambulatory Visit: Payer: 59 | Admitting: Allergy

## 2020-07-21 NOTE — Progress Notes (Deleted)
Follow Up Note  RE: Deborah Owens MRN: [id number] DOB: 06/11/1986 Date of Office Visit: 07/21/2020  Referring provider: Marcelle Overlie, MD Primary care provider: Marcelle Overlie, MD  Chief Complaint: No chief complaint on file.  History of Present Illness:  I had the pleasure of seeing Deborah Owens for a follow up visit at the [facility] on 07/21/2020. She is a 34 y.o. female, who is being followed for allergic rhinoconjunctivitis on AIT, atopic dermatitis and oral allergy syndrome. Her previous allergy office visit was on 04/16/2020 with Dr. Selena Batten. Today is a regular follow up visit.  Seasonal and perennial allergic rhinoconjunctivitis Past history - started AIT on 02/04/2020 (G-W-T and RW-DM-C-D-Horse-CR). Interim history - not taking any daily medications. Xhance not covered and OTC eye drops too expensive. Having some increase rhinitis symptoms the last week.   Continue allergy injections.  See below for environmental control measures.   May use over the counter antihistamines such as Xyzal (levocetirizine) daily as needed. ? Take on the days of injections. ? This can also help with the pruritus.  May use Flonase (fluticasone) nasal spray 1 spray per nostril twice a day as needed for nasal congestion.   May use azelastine eye drops 1 drop in each eye twice a day as needed for itchy/watery eyes.   Atopic dermatitis Topical steroid creams not effective. Still flares on hands, upper extremities, face and legs.  Continue skin care as below - avoid fragrances.   May use desonide 0.05% ointment sparingly to affected areas twice daily as needed to the face and/or neck. Care is to be taken to avoid the eyes.  May use mometasone 0.1% ointment sparingly to affected areas daily as needed below the face and neck. Care is to be taken to avoid the axillae and groin area.  Use triamcinolone:Eucerin mix twice a day as moisturizer.  Read about Dupixent injections and  will discuss in more detail at next visit if above regimen is not controlling the eczema.   Pollen-food allergy Avoiding fresh pitted fruits.  Continue to avoid fruits that are bothersome.  For mild symptoms you can take over the counter antihistamines such as Benadryl and monitor symptoms closely. If symptoms worsen or if you have severe symptoms including breathing issues, throat closure, significant swelling, whole body hives, severe diarrhea and vomiting, lightheadedness then inject epinephrine and seek immediate medical care afterwards.  Return in about 3 months (around 07/17/2020).   Assessment and Plan: Deborah Owens is a 34 y.o. female with: No problem-specific Assessment & Plan notes found for this encounter.  No follow-ups on file.  No orders of the defined types were placed in this encounter.  Lab Orders  No laboratory test(s) ordered today    Diagnostics: Spirometry:  Tracings reviewed. Her effort: {Blank single:19197::"Good reproducible efforts.","It was hard to get consistent efforts and there is a question as to whether this reflects a maximal maneuver.","Poor effort, data can not be interpreted."} FVC: ***L FEV1: ***L, ***% predicted FEV1/FVC ratio: ***% Interpretation: {Blank single:19197::"Spirometry consistent with mild obstructive disease","Spirometry consistent with moderate obstructive disease","Spirometry consistent with severe obstructive disease","Spirometry consistent with possible restrictive disease","Spirometry consistent with mixed obstructive and restrictive disease","Spirometry uninterpretable due to technique","Spirometry consistent with normal pattern","No overt abnormalities noted given today's efforts"}.  Please see scanned spirometry results for details.  Skin Testing: {Blank single:19197::"Select foods","Environmental allergy panel","Environmental allergy panel and select foods","Food allergy panel","None","Deferred due to recent antihistamines  use"}. Positive test to: ***. Negative test to: ***.  Results  discussed with patient/family.   Medication List:  Current Outpatient Medications  Medication Sig Dispense Refill  . albuterol (VENTOLIN HFA) 108 (90 Base) MCG/ACT inhaler SMARTSIG:1.5 Inhalation Via Inhaler Every 4-6 Hours PRN    . azelastine (OPTIVAR) 0.05 % ophthalmic solution Place 1 drop into both eyes 2 (two) times daily as needed (itchy/watery eyes). 6 mL 5  . betamethasone dipropionate 0.05 % cream Apply  a small amount to affected area twice a day    . desonide (DESOWEN) 0.05 % ointment Apply ointment sparingly to affected areas twice daily as needed to the face and/or neck. 15 g 5  . EPINEPHrine (EPIPEN 2-PAK) 0.3 mg/0.3 mL IJ SOAJ injection Inject 0.3 mLs (0.3 mg total) into the muscle once as needed for up to 1 dose for anaphylaxis. 2 each 1  . fluticasone (FLONASE) 50 MCG/ACT nasal spray Place 1 spray into both nostrils in the morning and at bedtime. 16 g 5  . hydrOXYzine (ATARAX/VISTARIL) 10 MG tablet SMARTSIG:1-3 Tablet(s) By Mouth PRN    . levocetirizine (XYZAL) 5 MG tablet Take 1 tablet (5 mg total) by mouth daily as needed for allergies. 30 tablet 5  . methocarbamol (ROBAXIN) 500 MG tablet Take 500 mg by mouth 2 (two) times daily.    . mometasone (ELOCON) 0.1 % ointment Apply daily as needed to the affected areas below face and neck. Avoid underarms and groin. 45 g 5  . triamcinolone (KENALOG) 0.025 % cream Apply twice daily for 2-3 weeks as needed for flares    . triamcinolone cream (KENALOG) 0.1 % USE ONCE OR TWICE DAILY AS DIRECTED FOR ECZEMA    . triamcinolone ointment (KENALOG) 0.1 % Apply 1 application topically 2 (two) times daily. Use as moisturizer. 453.6 g 2  . valACYclovir (VALTREX) 500 MG tablet Take 500 mg by mouth daily.     No current facility-administered medications for this visit.   Allergies: Allergies  Allergen Reactions  . Amoxicillin Nausea And Vomiting   I reviewed her past medical  history, social history, family history, and environmental history and no significant changes have been reported from her previous visit.  Review of Systems  Constitutional: Negative for appetite change, chills, fever and unexpected weight change.  HENT: Positive for congestion, rhinorrhea and sneezing.   Eyes: Positive for itching.  Respiratory: Negative for cough, chest tightness, shortness of breath and wheezing.   Cardiovascular: Negative for chest pain.  Gastrointestinal: Negative for abdominal pain.  Genitourinary: Negative for difficulty urinating.  Skin: Positive for rash.  Allergic/Immunologic: Positive for environmental allergies and food allergies.  Neurological: Negative for headaches.   Objective: There were no vitals taken for this visit. There is no height or weight on file to calculate BMI. Physical Exam Vitals and nursing note reviewed.  Constitutional:      Appearance: Normal appearance. She is well-developed.  HENT:     Head: Normocephalic and atraumatic.     Right Ear: Tympanic membrane and external ear normal.     Left Ear: Tympanic membrane and external ear normal.     Nose: Congestion present.     Mouth/Throat:     Mouth: Mucous membranes are moist.     Pharynx: Oropharynx is clear.  Eyes:     Conjunctiva/sclera: Conjunctivae normal.  Cardiovascular:     Rate and Rhythm: Normal rate and regular rhythm.     Heart sounds: Normal heart sounds. No murmur heard.   Pulmonary:     Effort: Pulmonary effort is normal.  Breath sounds: Normal breath sounds. No wheezing, rhonchi or rales.  Musculoskeletal:     Cervical back: Neck supple.  Skin:    General: Skin is warm.     Findings: Rash present.     Comments: Dry, leathery changes on hands. Hyperkeratotic patch on right forearm area. Slight dry patch on medial right eye area.  Neurological:     Mental Status: She is alert and oriented to person, place, and time.  Psychiatric:        Behavior: Behavior  normal.    Previous notes and tests were reviewed. The plan was reviewed with the patient/family, and all questions/concerned were addressed.  It was my pleasure to see Deborah Owens today and participate in her care. Please feel free to contact me with any questions or concerns.  Sincerely,  Wyline Mood, DO Allergy & Immunology  [facility] office: [phone] [facility] office: [phone]

## 2021-01-28 ENCOUNTER — Ambulatory Visit: Payer: 59 | Admitting: Allergy

## 2021-01-28 ENCOUNTER — Ambulatory Visit (INDEPENDENT_AMBULATORY_CARE_PROVIDER_SITE_OTHER): Payer: 59

## 2021-01-28 ENCOUNTER — Encounter: Payer: Self-pay | Admitting: Allergy

## 2021-01-28 ENCOUNTER — Other Ambulatory Visit: Payer: Self-pay

## 2021-01-28 VITALS — BP 106/72 | HR 78 | Temp 98.5°F | Resp 16 | Ht 65.35 in | Wt 143.5 lb

## 2021-01-28 DIAGNOSIS — R0602 Shortness of breath: Secondary | ICD-10-CM

## 2021-01-28 DIAGNOSIS — L2089 Other atopic dermatitis: Secondary | ICD-10-CM

## 2021-01-28 DIAGNOSIS — J302 Other seasonal allergic rhinitis: Secondary | ICD-10-CM | POA: Diagnosis not present

## 2021-01-28 DIAGNOSIS — J3089 Other allergic rhinitis: Secondary | ICD-10-CM | POA: Diagnosis not present

## 2021-01-28 DIAGNOSIS — T781XXD Other adverse food reactions, not elsewhere classified, subsequent encounter: Secondary | ICD-10-CM

## 2021-01-28 DIAGNOSIS — H1010 Acute atopic conjunctivitis, unspecified eye: Secondary | ICD-10-CM

## 2021-01-28 DIAGNOSIS — L209 Atopic dermatitis, unspecified: Secondary | ICD-10-CM

## 2021-01-28 DIAGNOSIS — H1013 Acute atopic conjunctivitis, bilateral: Secondary | ICD-10-CM | POA: Diagnosis not present

## 2021-01-28 MED ORDER — ALBUTEROL SULFATE HFA 108 (90 BASE) MCG/ACT IN AERS: 2.0000 | INHALATION_SPRAY | RESPIRATORY_TRACT | 1 refills | Status: AC | PRN

## 2021-01-28 MED ORDER — DUPILUMAB 300 MG/2ML ~~LOC~~ SOSY
600.0000 mg | PREFILLED_SYRINGE | SUBCUTANEOUS | Status: AC
  Administered 2021-01-28 – 2021-02-11 (×2): 600 mg via SUBCUTANEOUS

## 2021-01-28 MED ORDER — EPINEPHRINE 0.3 MG/0.3ML IJ SOAJ: 0.3000 mg | Freq: Once | INTRAMUSCULAR | 1 refills | Status: DC | PRN

## 2021-01-28 MED ORDER — TRIAMCINOLONE ACETONIDE 0.1 % EX OINT: 1.0000 "application " | TOPICAL_OINTMENT | Freq: Two times a day (BID) | CUTANEOUS | 2 refills | Status: AC | PRN

## 2021-01-28 MED ORDER — FLUTICASONE PROPIONATE 50 MCG/ACT NA SUSP: 1.0000 | Freq: Two times a day (BID) | NASAL | 5 refills | Status: DC

## 2021-01-28 MED ORDER — LEVOCETIRIZINE DIHYDROCHLORIDE 5 MG PO TABS: 5.0000 mg | ORAL_TABLET | Freq: Every evening | ORAL | 5 refills | Status: DC

## 2021-01-28 NOTE — Progress Notes (Signed)
Follow Up Note  RE: Deborah Owens MRN: [id number] DOB: 09-04-1985 Date of Office Visit: 01/28/2021  Referring provider: Marcelle Overlie, MD Primary care provider: Marcelle Overlie, MD  Chief Complaint: Allergies (Needs nasal spray refill/Congestion and stuffiness) and Eczema (Bad flare up on arms, legs, neck, fingers, and under eyes)  History of Present Illness: I had the pleasure of seeing [name] for a follow up visit at the [facility] on 01/28/2021. She is a 35 y.o. female, who is being followed for allergic rhinoconjunctivitis, atopic dermatitis and oral allergy syndrome. Her previous allergy office visit was on 04/16/2020 with Dr. Selena Batten. Today is a new complaint visit of rash .  Seasonal and perennial allergic rhinoconjunctivitis Patient stopped AIT due to work schedule and would like to re-start as she believes they were helping. She ran out of allergy medication about 1 week ago. She also ran out of Flonase and now having sinus pressure.  Noted some shortness of breath at times. Used to have albuterol inhaler in the past.   Atopic dermatitis The eczema is still the same. Desonide does help the facial rash but the mometasone caused burning. Currently moisturizing with Vanicream.  Patient interested in started Dupixent injections as well.    Pollen-food allergy Avoiding fresh fruits.  Assessment and Plan: Deborah Owens is a 35 y.o. female with: Seasonal and perennial allergic rhinoconjunctivitis Past history - started AIT on 02/04/2020 (G-W-T and RW-DM-C-D-Horse-CR). Interim history - stopped AIT in November 2021 due to work schedule but wants to restart. Increasing symptoms.  Restart allergy injections starting with blue vial schedule A. See below for environmental control measures.  Use Xyzal (levocetirizine) daily as needed. May take twice a day during allergy flares.  May use Flonase (fluticasone) nasal spray 1 spray per nostril twice a day as needed for  nasal congestion.  May use azelastine eye drops 1 drop in each eye twice a day as needed for itchy/watery eyes.   Pollen-food allergy Continue to avoid fruits that are bothersome. For mild symptoms you can take over the counter antihistamines such as Benadryl and monitor symptoms closely. If symptoms worsen or if you have severe symptoms including breathing issues, throat closure, significant swelling, whole body hives, severe diarrhea and vomiting, lightheadedness then inject epinephrine and seek immediate medical care afterwards.  Shortness of breath Used to have albuterol and now having some shortness of breath at times. Normal spirometry today.  May use albuterol rescue inhaler 2 puffs every 4 to 6 hours as needed for shortness of breath, chest tightness, coughing, and wheezing. May use albuterol rescue inhaler 2 puffs 5 to 15 minutes prior to strenuous physical activities. Monitor frequency of use.   Atopic dermatitis Unchanged and interested in starting Dupixent. Mometasone burns. Continue skin care as below. Start Dupixent injections 600mg  loading dose then 300mg  every 2 weeks. Not interested in self injections. Paperwork signed.  May use desonide 0.05% ointment sparingly to affected areas twice daily as needed to the face and/or neck. Care is to be taken to avoid the eyes. Use triamcinolone 0.1% ointment twice a day as needed for eczema flares. Do not use on the face, neck, armpits or groin area. Do not use more than 3 weeks in a row.   Return in about 3 months (around 04/30/2021).  Meds ordered this encounter  Medications   albuterol (VENTOLIN HFA) 108 (90 Base) MCG/ACT inhaler    Sig: Inhale 2 puffs into the lungs every 4 (four) hours as needed  for wheezing or shortness of breath (coughing fits).    Dispense:  18 g    Refill:  1   levocetirizine (XYZAL) 5 MG tablet    Sig: Take 1 tablet (5 mg total) by mouth every evening.    Dispense:  30 tablet    Refill:  5   fluticasone  (FLONASE) 50 MCG/ACT nasal spray    Sig: Place 1 spray into both nostrils in the morning and at bedtime.    Dispense:  16 g    Refill:  5   EPINEPHrine (EPIPEN 2-PAK) 0.3 mg/0.3 mL IJ SOAJ injection    Sig: Inject 0.3 mg into the muscle once as needed for up to 1 dose for anaphylaxis.    Dispense:  2 each    Refill:  1    Generic [employer].   triamcinolone ointment (KENALOG) 0.1 %    Sig: Apply 1 application topically 2 (two) times daily as needed (rash). Do not use on the face, neck, armpits or groin area. Do not use more than 3 weeks in a row.    Dispense:  30 g    Refill:  2    Lab Orders  No laboratory test(s) ordered today    Diagnostics: Spirometry:  Tracings reviewed. Her effort: It was hard to get consistent efforts and there is a question as to whether this reflects a maximal maneuver. FVC: 3.02L FEV1: 2.81L, 101% predicted FEV1/FVC ratio: 93% Interpretation: No overt abnormalities noted given today's efforts.  Please see scanned spirometry results for details.  Medication List:  Current Outpatient Medications  Medication Sig Dispense Refill   albuterol (VENTOLIN HFA) 108 (90 Base) MCG/ACT inhaler Inhale 2 puffs into the lungs every 4 (four) hours as needed for wheezing or shortness of breath (coughing fits). 18 g 1   azelastine (OPTIVAR) 0.05 % ophthalmic solution Place 1 drop into both eyes 2 (two) times daily as needed (itchy/watery eyes). 6 mL 5   betamethasone dipropionate 0.05 % cream Apply  a small amount to affected area twice a day     desonide (DESOWEN) 0.05 % ointment Apply ointment sparingly to affected areas twice daily as needed to the face and/or neck. 15 g 5   EPINEPHrine (EPIPEN 2-PAK) 0.3 mg/0.3 mL IJ SOAJ injection Inject 0.3 mg into the muscle once as needed for up to 1 dose for anaphylaxis. 2 each 1   fluticasone (FLONASE) 50 MCG/ACT nasal spray Place 1 spray into both nostrils in the morning and at bedtime. 16 g 5   hydrOXYzine (ATARAX/VISTARIL)  10 MG tablet SMARTSIG:1-3 Tablet(s) By Mouth PRN     levocetirizine (XYZAL) 5 MG tablet Take 1 tablet (5 mg total) by mouth every evening. 30 tablet 5   methocarbamol (ROBAXIN) 500 MG tablet Take 500 mg by mouth 2 (two) times daily.     triamcinolone ointment (KENALOG) 0.1 % Apply 1 application topically 2 (two) times daily as needed (rash). Do not use on the face, neck, armpits or groin area. Do not use more than 3 weeks in a row. 30 g 2   valACYclovir (VALTREX) 500 MG tablet Take 500 mg by mouth daily.     No current facility-administered medications for this visit.   Allergies: Allergies  Allergen Reactions   Amoxicillin Nausea And Vomiting   I reviewed her past medical history, social history, family history, and environmental history and no significant changes have been reported from her previous visit.  Review of Systems  Constitutional:  Negative for  appetite change, chills, fever and unexpected weight change.  HENT:  Positive for congestion. Negative for rhinorrhea and sneezing.   Eyes:  Positive for itching.  Respiratory:  Negative for cough, chest tightness, shortness of breath and wheezing.   Cardiovascular:  Negative for chest pain.  Gastrointestinal:  Negative for abdominal pain.  Genitourinary:  Negative for difficulty urinating.  Skin:  Positive for rash.  Allergic/Immunologic: Positive for environmental allergies and food allergies.  Neurological:  Negative for headaches.  Objective: BP 106/72 (BP Location: Right Arm, Patient Position: Sitting, Cuff Size: Normal)   Pulse 78   Temp 98.5 F (36.9 C) (Temporal)   Resp 16   Ht 5' 5.35" (1.66 m)   Wt 143 lb 8 oz (65.1 kg)   SpO2 98%   BMI 23.62 kg/m  Body mass index is 23.62 kg/m. Physical Exam Vitals and nursing note reviewed.  Constitutional:      Appearance: Normal appearance. She is well-developed.  HENT:     Head: Normocephalic and atraumatic.     Right Ear: Tympanic membrane and external ear normal.      Left Ear: Tympanic membrane and external ear normal.     Nose: Congestion present.     Mouth/Throat:     Mouth: Mucous membranes are moist.     Pharynx: Oropharynx is clear.  Eyes:     Conjunctiva/sclera: Conjunctivae normal.  Cardiovascular:     Rate and Rhythm: Normal rate and regular rhythm.     Heart sounds: Normal heart sounds. No murmur heard. Pulmonary:     Effort: Pulmonary effort is normal.     Breath sounds: Normal breath sounds. No wheezing, rhonchi or rales.  Musculoskeletal:     Cervical back: Neck supple.  Skin:    General: Skin is warm.     Findings: Rash present.     Comments: Dry, hyperpigmented patches on antecubital fossa b/l.  Hyperkeratotic patch on right forearm area. Dry patches periorbitally.  Neurological:     Mental Status: She is alert and oriented to person, place, and time.  Psychiatric:        Behavior: Behavior normal.  Previous notes and tests were reviewed. The plan was reviewed with the patient/family, and all questions/concerned were addressed.  It was my pleasure to see Lynae today and participate in her care. Please feel free to contact me with any questions or concerns.  Sincerely,  Wyline Mood, DO Allergy & Immunology  [facility] office: [phone] [facility] office: [phone]

## 2021-01-28 NOTE — Patient Instructions (Addendum)
Allergic rhino conjunctivitis Restart allergy injections - make appointment for 3 weeks.  See below for environmental control measures.  Use Xyzal (levocetirizine) daily as needed. May take twice a day during allergy flares.  May use Flonase (fluticasone) nasal spray 1 spray per nostril twice a day as needed for nasal congestion.  May use azelastine eye drops 1 drop in each eye twice a day as needed for itchy/watery eyes.   Atopic dermatitis Continue skin care as below. Start Dupixent injections - come back later today.  May use desonide 0.05% ointment sparingly to affected areas twice daily as needed to the face and/or neck. Care is to be taken to avoid the eyes. Use triamcinolone 0.1% ointment twice a day as needed for eczema flares. Do not use on the face, neck, armpits or groin area. Do not use more than 3 weeks in a row.    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 For mild symptoms you can take over the counter antihistamines such as Benadryl and monitor symptoms closely. If symptoms worsen or if you have severe symptoms including breathing issues, throat closure, significant swelling, whole body hives, severe diarrhea and vomiting, lightheadedness then inject epinephrine and seek immediate medical care afterwards.  Breathing: May use albuterol rescue inhaler 2 puffs every 4 to 6 hours as needed for shortness of breath, chest tightness, coughing, and wheezing. May use albuterol rescue inhaler 2 puffs 5 to 15 minutes prior to strenuous physical activities. Monitor frequency of use.   Follow up in 3 months or sooner if needed.  Skin care recommendations  Bath time: Always use lukewarm water. AVOID very hot or cold water. Keep bathing time to 5-10 minutes. Do NOT use bubble bath. Use a mild soap and use just enough to wash the dirty areas. Do NOT scrub skin vigorously.  After bathing, pat dry your skin with a towel. Do NOT rub or scrub the  skin.  Moisturizers and prescriptions:  ALWAYS apply moisturizers immediately after bathing (within 3 minutes). This helps to lock-in moisture. Use the moisturizer several times a day over the whole body. Good summer moisturizers include: Aveeno, CeraVe, Cetaphil. Good winter moisturizers include: Aquaphor, Vaseline, Cerave, Cetaphil, Eucerin, Vanicream. When using moisturizers along with medications, the moisturizer should be applied about one hour after applying the medication to prevent diluting effect of the medication or moisturize around where you applied the medications. When not using medications, the moisturizer can be continued twice daily as maintenance.  Laundry and clothing: Avoid laundry products with added color or perfumes. Use unscented hypo-allergenic laundry products such as Tide free, Cheer free & gentle, and All free and clear.  If the skin still seems dry or sensitive, you can try double-rinsing the clothes. Avoid tight or scratchy clothing such as wool. Do not use fabric softeners or dyer sheets.  Reducing Pollen Exposure Pollen seasons: trees (spring), grass (summer) and ragweed/weeds (fall). Keep windows closed in your home and car to lower pollen exposure.  Install air conditioning in the bedroom and throughout the house if possible.  Avoid going out in dry windy days - especially early morning. Pollen counts are highest between 5 - 10 AM and on dry, hot and windy days.  Save outside activities for late afternoon or after a heavy rain, when pollen levels are lower.  Avoid mowing of grass if you have grass pollen allergy. Be aware that pollen can also be transported indoors on people and pets.  Dry your clothes in an  automatic dryer rather than hanging them outside where they might collect pollen.  Rinse hair and eyes before bedtime. Control of House Dust Mite Allergen Dust mite allergens are a common trigger of allergy and asthma symptoms. While they can be found  throughout the house, these microscopic creatures thrive in warm, humid environments such as bedding, upholstered furniture and carpeting. Because so much time is spent in the bedroom, it is essential to reduce mite levels there.  Encase pillows, mattresses, and box springs in special allergen-proof fabric covers or airtight, zippered plastic covers.  Bedding should be washed weekly in hot water (130 F) and dried in a hot dryer. Allergen-proof covers are available for comforters and pillows that can't be regularly washed.  Wash the allergy-proof covers every few months. Minimize clutter in the bedroom. Keep pets out of the bedroom.  Keep humidity less than 50% by using a dehumidifier or air conditioning. You can buy a humidity measuring device called a hygrometer to monitor this.  If possible, replace carpets with hardwood, linoleum, or washable area rugs. If that's not possible, vacuum frequently with a vacuum that has a HEPA filter. Remove all upholstered furniture and non-washable window drapes from the bedroom. Remove all non-washable stuffed toys from the bedroom.  Wash stuffed toys weekly. Pet Allergen Avoidance: Contrary to popular opinion, there are no "hypoallergenic" breeds of dogs or cats. That is because people are not allergic to an animal's hair, but to an allergen found in the animal's saliva, dander (dead skin flakes) or urine. Pet allergy symptoms typically occur within minutes. For some people, symptoms can build up and become most severe 8 to 12 hours after contact with the animal. People with severe allergies can experience reactions in public places if dander has been transported on the pet owners' clothing. Keeping an animal outdoors is only a partial solution, since homes with pets in the yard still have higher concentrations of animal allergens. Before getting a pet, ask your allergist to determine if you are allergic to animals. If your pet is already considered part of your  family, try to minimize contact and keep the pet out of the bedroom and other rooms where you spend a great deal of time. As with dust mites, vacuum carpets often or replace carpet with a hardwood floor, tile or linoleum. High-efficiency particulate air (HEPA) cleaners can reduce allergen levels over time. While dander and saliva are the source of cat and dog allergens, urine is the source of allergens from rabbits, hamsters, mice and [place] pigs; so ask a non-allergic family member to clean the animal's cage. If you have a pet allergy, talk to your allergist about the potential for allergy immunotherapy (allergy shots). This strategy can often provide long-term relief. Cockroach Allergen Avoidance Cockroaches are often found in the homes of densely populated urban areas, schools or commercial buildings, but these creatures can lurk almost anywhere. This does not mean that you have a dirty house or living area. Block all areas where roaches can enter the home. This includes crevices, wall cracks and windows.  Cockroaches need water to survive, so fix and seal all leaky faucets and pipes. Have an exterminator go through the house when your family and pets are gone to eliminate any remaining roaches. Keep food in lidded containers and put pet food dishes away after your pets are done eating. Vacuum and sweep the floor after meals, and take out garbage and recyclables. Use lidded garbage containers in the kitchen. Wash dishes immediately after  use and clean under stoves, refrigerators or toasters where crumbs can accumulate. Wipe off the stove and other kitchen surfaces and cupboards regularly.

## 2021-01-28 NOTE — Assessment & Plan Note (Signed)
Past history - started AIT on 02/04/2020 (G-W-T and [id number]-Horse-CR). Interim history - stopped AIT in November 2021 due to work schedule but wants to restart. Increasing symptoms.   Restart allergy injections starting with blue vial schedule A.  See below for environmental control measures.   Use Xyzal (levocetirizine) daily as needed. May take twice a day during allergy flares.   May use Flonase (fluticasone) nasal spray 1 spray per nostril twice a day as needed for nasal congestion.   May use azelastine eye drops 1 drop in each eye twice a day as needed for itchy/watery eyes.

## 2021-01-28 NOTE — Assessment & Plan Note (Signed)
Unchanged and interested in starting Dupixent. Mometasone burns.  Continue skin care as below.  Start Dupixent injections 600mg  loading dose then 300mg  every 2 weeks. Not interested in self injections.  Paperwork signed.   May use desonide 0.05% ointment sparingly to affected areas twice daily as needed to the face and/or neck. Care is to be taken to avoid the eyes.  Use triamcinolone 0.1% ointment twice a day as needed for eczema flares. Do not use on the face, neck, armpits or groin area. Do not use more than 3 weeks in a row.

## 2021-01-28 NOTE — Assessment & Plan Note (Addendum)
Used to have albuterol and now having some shortness of breath at times. . Normal spirometry today.  . May use albuterol rescue inhaler 2 puffs every 4 to 6 hours as needed for shortness of breath, chest tightness, coughing, and wheezing. May use albuterol rescue inhaler 2 puffs 5 to 15 minutes prior to strenuous physical activities. Monitor frequency of use.

## 2021-01-28 NOTE — Progress Notes (Signed)
VIALS MADE. EXP 01-29-22

## 2021-01-28 NOTE — Assessment & Plan Note (Signed)
   Continue to avoid fruits that are bothersome.  For mild symptoms you can take over the counter antihistamines such as Benadryl and monitor symptoms closely. If symptoms worsen or if you have severe symptoms including breathing issues, throat closure, significant swelling, whole body hives, severe diarrhea and vomiting, lightheadedness then inject epinephrine and seek immediate medical care afterwards.

## 2021-02-01 ENCOUNTER — Telehealth: Payer: Self-pay | Admitting: *Deleted

## 2021-02-01 DIAGNOSIS — J301 Allergic rhinitis due to pollen: Secondary | ICD-10-CM | POA: Diagnosis not present

## 2021-02-01 NOTE — Telephone Encounter (Signed)
I called patient back and advised she can have delivered to office for admin or bring into office if she does not want to self admin and she advised she wanted to try to do her own injections

## 2021-02-01 NOTE — Telephone Encounter (Signed)
-----   Message from Ellamae Sia, DO sent at 01/28/2021  1:00 PM EDT ----- Please start PA for Dupixent 300mg  every 2 weeks for eczema. Will give loading dose 600mg  today in office with sample. Thank you.

## 2021-02-01 NOTE — Telephone Encounter (Signed)
Called patient and advised approval, submit to [facility]) pharmacy and copay card to be emailed to patient.  She did not get instrux for self admin so I advised patient to bring same into office for next dose to have nurse show her how to administer same

## 2021-02-02 ENCOUNTER — Telehealth: Payer: Self-pay | Admitting: Allergy

## 2021-02-02 DIAGNOSIS — J3089 Other allergic rhinitis: Secondary | ICD-10-CM | POA: Diagnosis not present

## 2021-02-02 MED ORDER — DESONIDE 0.05 % EX OINT: TOPICAL_OINTMENT | CUTANEOUS | 5 refills | Status: DC

## 2021-02-02 NOTE — Telephone Encounter (Signed)
Spoke with patient, informed her that refills have been sent to the requested pharmacy. Patient verbalized understanding. 

## 2021-02-02 NOTE — Telephone Encounter (Signed)
Patient needs refill on [place] sent to [facility] on [address]. Patient's eczema on eye is flared up. Pharmacy states they were sending over a request.

## 2021-02-11 ENCOUNTER — Ambulatory Visit (INDEPENDENT_AMBULATORY_CARE_PROVIDER_SITE_OTHER): Payer: 59

## 2021-02-11 ENCOUNTER — Ambulatory Visit: Payer: Self-pay

## 2021-02-11 ENCOUNTER — Other Ambulatory Visit: Payer: Self-pay

## 2021-02-11 DIAGNOSIS — L209 Atopic dermatitis, unspecified: Secondary | ICD-10-CM

## 2021-02-18 ENCOUNTER — Ambulatory Visit: Payer: 59

## 2021-02-25 ENCOUNTER — Ambulatory Visit: Payer: Self-pay

## 2021-03-03 ENCOUNTER — Telehealth: Payer: 59 | Admitting: Physician Assistant

## 2021-03-03 DIAGNOSIS — R300 Dysuria: Secondary | ICD-10-CM | POA: Diagnosis not present

## 2021-03-03 MED ORDER — NITROFURANTOIN MONOHYD MACRO 100 MG PO CAPS: 100.0000 mg | ORAL_CAPSULE | Freq: Two times a day (BID) | ORAL | 0 refills | Status: AC

## 2021-03-03 NOTE — Progress Notes (Signed)
Virtual Visit Consent   Deborah Owens, you are scheduled for a virtual visit with a [facility] provider today.     Just as with appointments in the office, your consent must be obtained to participate.  Your consent will be active for this visit and any virtual visit you may have with one of our providers in the next 365 days.     If you have a MyChart account, a copy of this consent can be sent to you electronically.  All virtual visits are billed to your insurance company just like a traditional visit in the office.    As this is a virtual visit, video technology does not allow for your provider to perform a traditional examination.  This may limit your provider's ability to fully assess your condition.  If your provider identifies any concerns that need to be evaluated in person or the need to arrange testing (such as labs, EKG, etc.), we will make arrangements to do so.     Although advances in technology are sophisticated, we cannot ensure that it will always work on either your end or our end.  If the connection with a video visit is poor, the visit may have to be switched to a telephone visit.  With either a video or telephone visit, we are not always able to ensure that we have a secure connection.     I need to obtain your verbal consent now.   Are you willing to proceed with your visit today?    CLORIS FLIPPO has provided verbal consent on 03/03/2021 for a virtual visit (video or telephone).   Deborah Owens, [place]   Date: 03/03/2021 12:32 PM   Virtual Visit via Video Note   I, Deborah Owens, connected with  AMRI LIEN  ([id number], July 06, 1986) on 03/03/21 at 12:30 PM EDT by a video-enabled telemedicine application and verified that I am speaking with the correct person using two identifiers.  Location: Patient: Virtual Visit Location Patient: Home Provider: Virtual Visit Location Provider: Home Office   I discussed the limitations of evaluation and management by  telemedicine and the availability of in person appointments. The patient expressed understanding and agreed to proceed.    History of Present Illness: Deborah Owens is a 35 y.o. who identifies as a female who was assigned female at birth, and is being seen today for possible UTI. Endorses 2 days of urinary urgency, frequency, hesitancy with dysuria. Denies fever, chills, nausea, vomiting, flank pain or back pain. LMP within past 3 weeks. No concerns for pregnancy. Denies vaginal symptoms. Has history of cystitis and noted this feels identical to prior episodes.   HPI: HPI  Problems:  Patient Active Problem List   Diagnosis Date Noted   Shortness of breath 01/28/2021   Seasonal and perennial allergic rhinoconjunctivitis 01/14/2020   Atopic dermatitis 01/14/2020   Pollen-food allergy 01/14/2020    Allergies:  Allergies  Allergen Reactions   Amoxicillin Nausea And Vomiting   Medications:  Current Outpatient Medications:    nitrofurantoin, macrocrystal-monohydrate, (MACROBID) 100 MG capsule, Take 1 capsule (100 mg total) by mouth 2 (two) times daily., Disp: 10 capsule, Rfl: 0   albuterol (VENTOLIN HFA) 108 (90 Base) MCG/ACT inhaler, Inhale 2 puffs into the lungs every 4 (four) hours as needed for wheezing or shortness of breath (coughing fits)., Disp: 18 g, Rfl: 1   azelastine (OPTIVAR) 0.05 % ophthalmic solution, Place 1 drop into both eyes 2 (two) times daily as needed (  itchy/watery eyes)., Disp: 6 mL, Rfl: 5   betamethasone dipropionate 0.05 % cream, Apply  a small amount to affected area twice a day, Disp: , Rfl:    desonide (DESOWEN) 0.05 % ointment, Apply ointment sparingly to affected areas twice daily as needed to the face and/or neck., Disp: 15 g, Rfl: 5   EPINEPHrine (EPIPEN 2-PAK) 0.3 mg/0.3 mL IJ SOAJ injection, Inject 0.3 mg into the muscle once as needed for up to 1 dose for anaphylaxis., Disp: 2 each, Rfl: 1   fluticasone (FLONASE) 50 MCG/ACT nasal spray, Place 1 spray into both  nostrils in the morning and at bedtime., Disp: 16 g, Rfl: 5   hydrOXYzine (ATARAX/VISTARIL) 10 MG tablet, SMARTSIG:1-3 Tablet(s) By Mouth PRN, Disp: , Rfl:    levocetirizine (XYZAL) 5 MG tablet, Take 1 tablet (5 mg total) by mouth every evening., Disp: 30 tablet, Rfl: 5   methocarbamol (ROBAXIN) 500 MG tablet, Take 500 mg by mouth 2 (two) times daily., Disp: , Rfl:    triamcinolone ointment (KENALOG) 0.1 %, Apply 1 application topically 2 (two) times daily as needed (rash). Do not use on the face, neck, armpits or groin area. Do not use more than 3 weeks in a row., Disp: 30 g, Rfl: 2   valACYclovir (VALTREX) 500 MG tablet, Take 500 mg by mouth daily., Disp: , Rfl:   Current Facility-Administered Medications:    dupilumab (DUPIXENT) prefilled syringe 600 mg, 600 mg, Subcutaneous, Q14 Days, Ellamae Sia, DO, 600 mg at 02/11/21 1717  Observations/Objective: Patient is well-developed, well-nourished in no acute distress.  Resting comfortably at home.  Head is normocephalic, atraumatic.  No labored breathing. Speech is clear and coherent with logical content.  Patient is alert and oriented at baseline.   Assessment and Plan: 1. Dysuria - nitrofurantoin, macrocrystal-monohydrate, (MACROBID) 100 MG capsule; Take 1 capsule (100 mg total) by mouth 2 (two) times daily.  Dispense: 10 capsule; Refill: 0 Symptoms consistent with uncomplicated cystitis. Will empirically treat with Macrobid 100 mg BID x 5 days. Supportive measures and OTC medications reviewed with patient. Strict in-office follow-up precautions reviewed.  Follow Up Instructions: I discussed the assessment and treatment plan with the patient. The patient was provided an opportunity to ask questions and all were answered. The patient agreed with the plan and demonstrated an understanding of the instructions.  A copy of instructions were sent to the patient via MyChart.  The patient was advised to call back or seek an in-person evaluation if  the symptoms worsen or if the condition fails to improve as anticipated.  Time:  I spent 10 minutes with the patient via telehealth technology discussing the above problems/concerns.    Deborah Climes, PA-C

## 2021-03-03 NOTE — Patient Instructions (Signed)
Deborah Owens, thank you for joining [name], PA-C for today's virtual visit.  While this provider is not your primary care provider (PCP), if your PCP is located in our provider database this encounter information will be shared with them immediately following your visit.  Consent: (Patient) Deborah Owens provided verbal consent for this virtual visit at the beginning of the encounter.  Current Medications:  Current Outpatient Medications:    albuterol (VENTOLIN HFA) 108 (90 Base) MCG/ACT inhaler, Inhale 2 puffs into the lungs every 4 (four) hours as needed for wheezing or shortness of breath (coughing fits)., Disp: 18 g, Rfl: 1   azelastine (OPTIVAR) 0.05 % ophthalmic solution, Place 1 drop into both eyes 2 (two) times daily as needed (itchy/watery eyes)., Disp: 6 mL, Rfl: 5   betamethasone dipropionate 0.05 % cream, Apply  a small amount to affected area twice a day, Disp: , Rfl:    desonide (DESOWEN) 0.05 % ointment, Apply ointment sparingly to affected areas twice daily as needed to the face and/or neck., Disp: 15 g, Rfl: 5   EPINEPHrine (EPIPEN 2-PAK) 0.3 mg/0.3 mL IJ SOAJ injection, Inject 0.3 mg into the muscle once as needed for up to 1 dose for anaphylaxis., Disp: 2 each, Rfl: 1   fluticasone (FLONASE) 50 MCG/ACT nasal spray, Place 1 spray into both nostrils in the morning and at bedtime., Disp: 16 g, Rfl: 5   hydrOXYzine (ATARAX/VISTARIL) 10 MG tablet, SMARTSIG:1-3 Tablet(s) By Mouth PRN, Disp: , Rfl:    levocetirizine (XYZAL) 5 MG tablet, Take 1 tablet (5 mg total) by mouth every evening., Disp: 30 tablet, Rfl: 5   methocarbamol (ROBAXIN) 500 MG tablet, Take 500 mg by mouth 2 (two) times daily., Disp: , Rfl:    triamcinolone ointment (KENALOG) 0.1 %, Apply 1 application topically 2 (two) times daily as needed (rash). Do not use on the face, neck, armpits or groin area. Do not use more than 3 weeks in a row., Disp: 30 g, Rfl: 2   valACYclovir (VALTREX) 500 MG tablet, Take  500 mg by mouth daily., Disp: , Rfl:   Current Facility-Administered Medications:    dupilumab (DUPIXENT) prefilled syringe 600 mg, 600 mg, Subcutaneous, Q14 Days, Ellamae Sia, DO, 600 mg at 02/11/21 1717   Medications ordered in this encounter:  No orders of the defined types were placed in this encounter.    *If you need refills on other medications prior to your next appointment, please contact your pharmacy*  Follow-Up: Call back or seek an in-person evaluation if the symptoms worsen or if the condition fails to improve as anticipated.  Other Instructions Your symptoms are consistent with a bladder infection, also called acute cystitis. Please take your antibiotic (Macrobid) as directed until all pills are gone.  Stay very well hydrated.  Consider a daily probiotic (Align, Culturelle, or Activia) to help prevent stomach upset caused by the antibiotic.  Taking a probiotic daily may also help prevent recurrent UTIs.  Also consider taking AZO (Phenazopyridine) tablets to help decrease pain with urination.    Urinary Tract Infection A urinary tract infection (UTI) can occur any place along the urinary tract. The tract includes the kidneys, ureters, bladder, and urethra. A type of germ called bacteria often causes a UTI. UTIs are often helped with antibiotic medicine.  HOME CARE  If given, take antibiotics as told by your doctor. Finish them even if you start to feel better. Drink enough fluids to keep your pee (urine) clear or  pale yellow. Avoid tea, drinks with caffeine, and bubbly (carbonated) drinks. Pee often. Avoid holding your pee in for a long time. Pee before and after having sex (intercourse). Wipe from front to back after you poop (bowel movement) if you are a woman. Use each tissue only once. GET HELP RIGHT AWAY IF:  You have back pain. You have lower belly (abdominal) pain. You have chills. You feel sick to your stomach (nauseous). You throw up (vomit). Your burning or  discomfort with peeing does not go away. You have a fever. Your symptoms are not better in 3 days. MAKE SURE YOU:  Understand these instructions. Will watch your condition. Will get help right away if you are not doing well or get worse. Document Released: 01/25/2008 Document Revised: 05/02/2012 Document Reviewed: 03/08/2012 [facility] Patient Information 2015 [place], [place]. This information is not intended to replace advice given to you by your health care provider. Make sure you discuss any questions you have with your health care provider.    If you have been instructed to have an in-person evaluation today at a local Urgent Care facility, please use the link below. It will take you to a list of all of our available [facility], including address, phone number and hours of operation. Please do not delay care.  [facility]  If you or a family member do not have a primary care provider, use the link below to schedule a visit and establish care. When you choose a [facility] primary care physician or advanced practice provider, you gain a long-term partner in health. Find a Primary Care Provider  Learn more about [facility]'s in-office and virtual care options: [facility] - Get Care Now

## 2021-03-09 ENCOUNTER — Ambulatory Visit (INDEPENDENT_AMBULATORY_CARE_PROVIDER_SITE_OTHER): Payer: 59 | Admitting: Orthopaedic Surgery

## 2021-03-09 ENCOUNTER — Other Ambulatory Visit: Payer: Self-pay

## 2021-03-09 ENCOUNTER — Ambulatory Visit (INDEPENDENT_AMBULATORY_CARE_PROVIDER_SITE_OTHER): Payer: 59

## 2021-03-09 ENCOUNTER — Encounter: Payer: Self-pay | Admitting: Orthopaedic Surgery

## 2021-03-09 ENCOUNTER — Ambulatory Visit: Payer: Self-pay

## 2021-03-09 DIAGNOSIS — M25561 Pain in right knee: Secondary | ICD-10-CM

## 2021-03-09 DIAGNOSIS — M25562 Pain in left knee: Secondary | ICD-10-CM

## 2021-03-09 MED ORDER — METHYLPREDNISOLONE ACETATE 40 MG/ML IJ SUSP
40.0000 mg | INTRAMUSCULAR | Status: AC | PRN
  Administered 2021-03-09: 40 mg via INTRA_ARTICULAR

## 2021-03-09 MED ORDER — LIDOCAINE HCL 1 % IJ SOLN
2.0000 mL | INTRAMUSCULAR | Status: AC | PRN
  Administered 2021-03-09: 2 mL

## 2021-03-09 MED ORDER — BUPIVACAINE HCL 0.5 % IJ SOLN
2.0000 mL | INTRAMUSCULAR | Status: AC | PRN
  Administered 2021-03-09: 2 mL via INTRA_ARTICULAR

## 2021-03-09 NOTE — Progress Notes (Signed)
Office Visit Note   Patient: Deborah Owens           Date of Birth: 10-03-1985           MRN: [id number] Visit Date: 03/09/2021              Requested by: Marcelle Overlie, MD [address] [place],  [place] [address] PCP: Marcelle Overlie, MD   Assessment & Plan: Visit Diagnoses:  1. Left knee pain, unspecified chronicity   2. Right knee pain, unspecified chronicity     Plan: Impression is left knee pain concerning for medial meniscus tear.  We have discussed trying a cortisone injection today and obtaining an MRI in the next few weeks if her symptoms have not improved versus going ahead and getting an MRI.  She would like to try the injection first.  She will call us if her symptoms have not improved we will obtain the MRI to assess for meniscal pathology.  In regards to the right knee, her symptoms are not bad enough for injection today.  I have provided her with a Voltaren handout.  Follow-up with [place] as needed.  Follow-Up Instructions: No follow-ups on file.   Orders:  Orders Placed This Encounter  Procedures   XR KNEE 3 VIEW RIGHT   XR KNEE 3 VIEW LEFT   No orders of the defined types were placed in this encounter.     Procedures: Large Joint Inj: L knee on 03/09/2021 7:44 PM Details: 22 G needle Medications: 2 mL bupivacaine 0.5 %; 2 mL lidocaine 1 %; 40 mg methylPREDNISolone acetate 40 MG/ML Outcome: tolerated well, no immediate complications Patient was prepped and draped in the usual sterile fashion.      Clinical Data: No additional findings.   Subjective: Chief Complaint  Patient presents with   Right Knee - Pain   Left Knee - Pain    HPI patient is a pleasant 35 year old female who comes in today with bilateral knee pain left greater than right for the past 2 and half weeks.  No known injury or change in activity.  The pain is primarily to the medial aspect of the left knee.  This is constant nature without any specific aggravators.  She  does have locking and catching with associated mild swelling.  She has been taking NSAIDs and Tylenol without significant relief.  No previous cortisone injection to either knee.  Review of Systems as detailed in HPI.  All others reviewed and are negative.   Objective: Vital Signs: There were no vitals taken for this visit.  Physical Exam well-developed well-nourished female no acute distress.  Alert and oriented x3.  Ortho Exam left knee exam shows trace effusion.  Range of motion 0 to 95 degrees.  Marked tenderness medial joint line.  Ligaments are stable.  She is neurovascular intact distally.  Right knee exam shows no effusion.  Range of motion 0 to 120 degrees.  Very minimal medial joint line tenderness.  Ligaments are stable.  She is neurovascular intact distally.  Specialty Comments:  No specialty comments available.  Imaging: No results found.   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   Past Medical History:  Diagnosis Date   Allergy    Atopic dermatitis 01/14/2020   Heart murmur     Family History  Problem Relation Age of Onset   Allergic rhinitis Mother    Eczema Neg Hx    Urticaria Neg Hx    Asthma Neg Hx     Past Surgical History:  Procedure Laterality Date   KNEE SURGERY     Social History   Occupational History   Occupation: Retail buyer: [employer]   Tobacco Use   Smoking status: Every Day    Types: Cigarettes   Smokeless tobacco: Never  Vaping Use   Vaping Use: Never used  Substance and Sexual Activity   Alcohol use: Yes    Comment: Socially   Drug use: No   Sexual activity: Not on file

## 2021-03-11 ENCOUNTER — Ambulatory Visit: Payer: 59

## 2021-03-16 ENCOUNTER — Ambulatory Visit: Payer: Self-pay

## 2021-03-18 ENCOUNTER — Telehealth: Payer: Self-pay

## 2021-03-18 ENCOUNTER — Ambulatory Visit: Payer: 59

## 2021-03-18 ENCOUNTER — Other Ambulatory Visit: Payer: Self-pay

## 2021-03-18 DIAGNOSIS — M25562 Pain in left knee: Secondary | ICD-10-CM

## 2021-03-18 NOTE — Telephone Encounter (Signed)
Pt called asking if she had a referral sent in for a MRI

## 2021-03-18 NOTE — Telephone Encounter (Signed)
Patient aware.

## 2021-03-18 NOTE — Telephone Encounter (Signed)
Per last note if not any better after inj get MRI.  MRI Order placed

## 2021-03-23 ENCOUNTER — Ambulatory Visit: Payer: 59 | Admitting: Orthopaedic Surgery

## 2021-03-31 ENCOUNTER — Other Ambulatory Visit: Payer: 59

## 2021-04-02 ENCOUNTER — Other Ambulatory Visit: Payer: 59

## 2021-05-06 ENCOUNTER — Ambulatory Visit: Payer: Self-pay | Admitting: Allergy

## 2021-05-06 DIAGNOSIS — J309 Allergic rhinitis, unspecified: Secondary | ICD-10-CM

## 2021-05-06 NOTE — Progress Notes (Deleted)
Follow Up Note  RE: Deborah Owens MRN: [id number] DOB: [birth year] Date of Office Visit: 05/06/2021  Referring provider: Marcelle Overlie, MD Primary care provider: Marcelle Overlie, MD  Chief Complaint: No chief complaint on file.  History of Present Illness: I had the pleasure of seeing Deborah Owens for a follow up visit at the [facility] on 05/06/2021. She is a 35 y.o. female, who is being followed for allergic rhinoconjunctivitis, oral allergy syndrome, shortness of breath and atopic dermatitis. Her previous allergy office visit was on 01/28/2021 with Dr. Selena Batten. Today is a regular follow up visit.  Seasonal and perennial allergic rhinoconjunctivitis Past history - started AIT on 02/04/2020 (G-W-T and RW-[id number]-D-Horse-CR). Interim history - stopped AIT in November 2021 due to work schedule but wants to restart. Increasing symptoms.  Restart allergy injections starting with blue vial schedule A. See below for environmental control measures.  Use Xyzal (levocetirizine) daily as needed. May take twice a day during allergy flares.  May use Flonase (fluticasone) nasal spray 1 spray per nostril twice a day as needed for nasal congestion.  May use azelastine eye drops 1 drop in each eye twice a day as needed for itchy/watery eyes.    Pollen-food allergy Continue to avoid fruits that are bothersome. For mild symptoms you can take over the counter antihistamines such as Benadryl and monitor symptoms closely. If symptoms worsen or if you have severe symptoms including breathing issues, throat closure, significant swelling, whole body hives, severe diarrhea and vomiting, lightheadedness then inject epinephrine and seek immediate medical care afterwards.   Shortness of breath Used to have albuterol and now having some shortness of breath at times. Normal spirometry today.  May use albuterol rescue inhaler 2 puffs every 4 to 6 hours as needed for shortness of breath, chest tightness,  coughing, and wheezing. May use albuterol rescue inhaler 2 puffs 5 to 15 minutes prior to strenuous physical activities. Monitor frequency of use.    Atopic dermatitis Unchanged and interested in starting Dupixent. Mometasone burns. Continue skin care as below. Start Dupixent injections 600mg  loading dose then 300mg  every 2 weeks. Not interested in self injections. Paperwork signed.  May use desonide 0.05% ointment sparingly to affected areas twice daily as needed to the face and/or neck. Care is to be taken to avoid the eyes. Use triamcinolone 0.1% ointment twice a day as needed for eczema flares. Do not use on the face, neck, armpits or groin area. Do not use more than 3 weeks in a row.    Return in about 3 months (around 04/30/2021).  Assessment and Plan: Deborah Owens is a 35 y.o. female with: No problem-specific Assessment & Plan notes found for this encounter.  No follow-ups on file.  No orders of the defined types were placed in this encounter.  Lab Orders  No laboratory test(s) ordered today    Diagnostics: Spirometry:  Tracings reviewed. Her effort: {Blank single:19197::"Good reproducible efforts.","It was hard to get consistent efforts and there is a question as to whether this reflects a maximal maneuver.","Poor effort, data can not be interpreted."} FVC: ***L FEV1: ***L, ***% predicted FEV1/FVC ratio: ***% Interpretation: {Blank single:19197::"Spirometry consistent with mild obstructive disease","Spirometry consistent with moderate obstructive disease","Spirometry consistent with severe obstructive disease","Spirometry consistent with possible restrictive disease","Spirometry consistent with mixed obstructive and restrictive disease","Spirometry uninterpretable due to technique","Spirometry consistent with normal pattern","No overt abnormalities noted given today's efforts"}.  Please see scanned spirometry results for details.  Skin Testing: {Blank single:19197::"Select  foods","Environmental allergy panel","Environmental  allergy panel and select foods","Food allergy panel","None","Deferred due to recent antihistamines use"}. *** Results discussed with patient/family.   Medication List:  Current Outpatient Medications  Medication Sig Dispense Refill   albuterol (VENTOLIN HFA) 108 (90 Base) MCG/ACT inhaler Inhale 2 puffs into the lungs every 4 (four) hours as needed for wheezing or shortness of breath (coughing fits). 18 g 1   azelastine (OPTIVAR) 0.05 % ophthalmic solution Place 1 drop into both eyes 2 (two) times daily as needed (itchy/watery eyes). 6 mL 5   betamethasone dipropionate 0.05 % cream Apply  a small amount to affected area twice a day     desonide (DESOWEN) 0.05 % ointment Apply ointment sparingly to affected areas twice daily as needed to the face and/or neck. 15 g 5   EPINEPHrine (EPIPEN 2-PAK) 0.3 mg/0.3 mL IJ SOAJ injection Inject 0.3 mg into the muscle once as needed for up to 1 dose for anaphylaxis. 2 each 1   fluticasone (FLONASE) 50 MCG/ACT nasal spray Place 1 spray into both nostrils in the morning and at bedtime. 16 g 5   hydrOXYzine (ATARAX/VISTARIL) 10 MG tablet SMARTSIG:1-3 Tablet(s) By Mouth PRN     levocetirizine (XYZAL) 5 MG tablet Take 1 tablet (5 mg total) by mouth every evening. 30 tablet 5   methocarbamol (ROBAXIN) 500 MG tablet Take 500 mg by mouth 2 (two) times daily.     nitrofurantoin, macrocrystal-monohydrate, (MACROBID) 100 MG capsule Take 1 capsule (100 mg total) by mouth 2 (two) times daily. 10 capsule 0   triamcinolone ointment (KENALOG) 0.1 % Apply 1 application topically 2 (two) times daily as needed (rash). Do not use on the face, neck, armpits or groin area. Do not use more than 3 weeks in a row. 30 g 2   valACYclovir (VALTREX) 500 MG tablet Take 500 mg by mouth daily.     Current Facility-Administered Medications  Medication Dose Route Frequency Provider Last Rate Last Admin   dupilumab (DUPIXENT) prefilled  syringe 600 mg  600 mg Subcutaneous Q14 Days Ellamae Sia, DO   600 mg at 02/11/21 1717   Allergies: Allergies  Allergen Reactions   Amoxicillin Nausea And Vomiting   I reviewed her past medical history, social history, family history, and environmental history and no significant changes have been reported from her previous visit.  Review of Systems  Constitutional:  Negative for appetite change, chills, fever and unexpected weight change.  HENT:  Positive for congestion. Negative for rhinorrhea and sneezing.   Eyes:  Positive for itching.  Respiratory:  Negative for cough, chest tightness, shortness of breath and wheezing.   Cardiovascular:  Negative for chest pain.  Gastrointestinal:  Negative for abdominal pain.  Genitourinary:  Negative for difficulty urinating.  Skin:  Positive for rash.  Allergic/Immunologic: Positive for environmental allergies and food allergies.  Neurological:  Negative for headaches.   Objective: There were no vitals taken for this visit. There is no height or weight on file to calculate BMI. Physical Exam Vitals and nursing note reviewed.  Constitutional:      Appearance: Normal appearance. She is well-developed.  HENT:     Head: Normocephalic and atraumatic.     Right Ear: Tympanic membrane and external ear normal.     Left Ear: Tympanic membrane and external ear normal.     Nose: Congestion present.     Mouth/Throat:     Mouth: Mucous membranes are moist.     Pharynx: Oropharynx is clear.  Eyes:  Conjunctiva/sclera: Conjunctivae normal.  Cardiovascular:     Rate and Rhythm: Normal rate and regular rhythm.     Heart sounds: Normal heart sounds. No murmur heard. Pulmonary:     Effort: Pulmonary effort is normal.     Breath sounds: Normal breath sounds. No wheezing, rhonchi or rales.  Musculoskeletal:     Cervical back: Neck supple.  Skin:    General: Skin is warm.     Findings: Rash present.     Comments: Dry, hyperpigmented patches on  antecubital fossa b/l.  Hyperkeratotic patch on right forearm area. Dry patches periorbitally.  Neurological:     Mental Status: She is alert and oriented to person, place, and time.  Psychiatric:        Behavior: Behavior normal.   Previous notes and tests were reviewed. The plan was reviewed with the patient/family, and all questions/concerned were addressed.  It was my pleasure to see Deborah Owens today and participate in her care. Please feel free to contact me with any questions or concerns.  Sincerely,  Wyline Mood, DO Allergy & Immunology  [facility] office: [phone] [facility] office: [phone]

## 2021-10-04 ENCOUNTER — Emergency Department (HOSPITAL_BASED_OUTPATIENT_CLINIC_OR_DEPARTMENT_OTHER): Payer: Self-pay | Admitting: Radiology

## 2021-10-04 ENCOUNTER — Emergency Department (HOSPITAL_BASED_OUTPATIENT_CLINIC_OR_DEPARTMENT_OTHER)
Admission: EM | Admit: 2021-10-04 | Discharge: 2021-10-04 | Disposition: A | Discharge status: Home / Self Care | Payer: Self-pay | Attending: Emergency Medicine | Admitting: Emergency Medicine

## 2021-10-04 ENCOUNTER — Encounter (HOSPITAL_BASED_OUTPATIENT_CLINIC_OR_DEPARTMENT_OTHER): Payer: Self-pay | Admitting: Emergency Medicine

## 2021-10-04 ENCOUNTER — Other Ambulatory Visit: Payer: Self-pay

## 2021-10-04 DIAGNOSIS — M25561 Pain in right knee: Secondary | ICD-10-CM | POA: Insufficient documentation

## 2021-10-04 DIAGNOSIS — M25562 Pain in left knee: Secondary | ICD-10-CM | POA: Insufficient documentation

## 2021-10-04 DIAGNOSIS — G8929 Other chronic pain: Secondary | ICD-10-CM | POA: Insufficient documentation

## 2021-10-04 IMAGING — DX DG KNEE COMPLETE 4+V*R*
4 series · 4 of 4 positions shown · non-contrast
Comparison: [DATE]

CLINICAL DATA: Bilateral knee pain for 4 days, no known injury

EXAM:
RIGHT KNEE - COMPLETE 4+ VIEW; LEFT KNEE - COMPLETE 4+ VIEW

[knee ap]
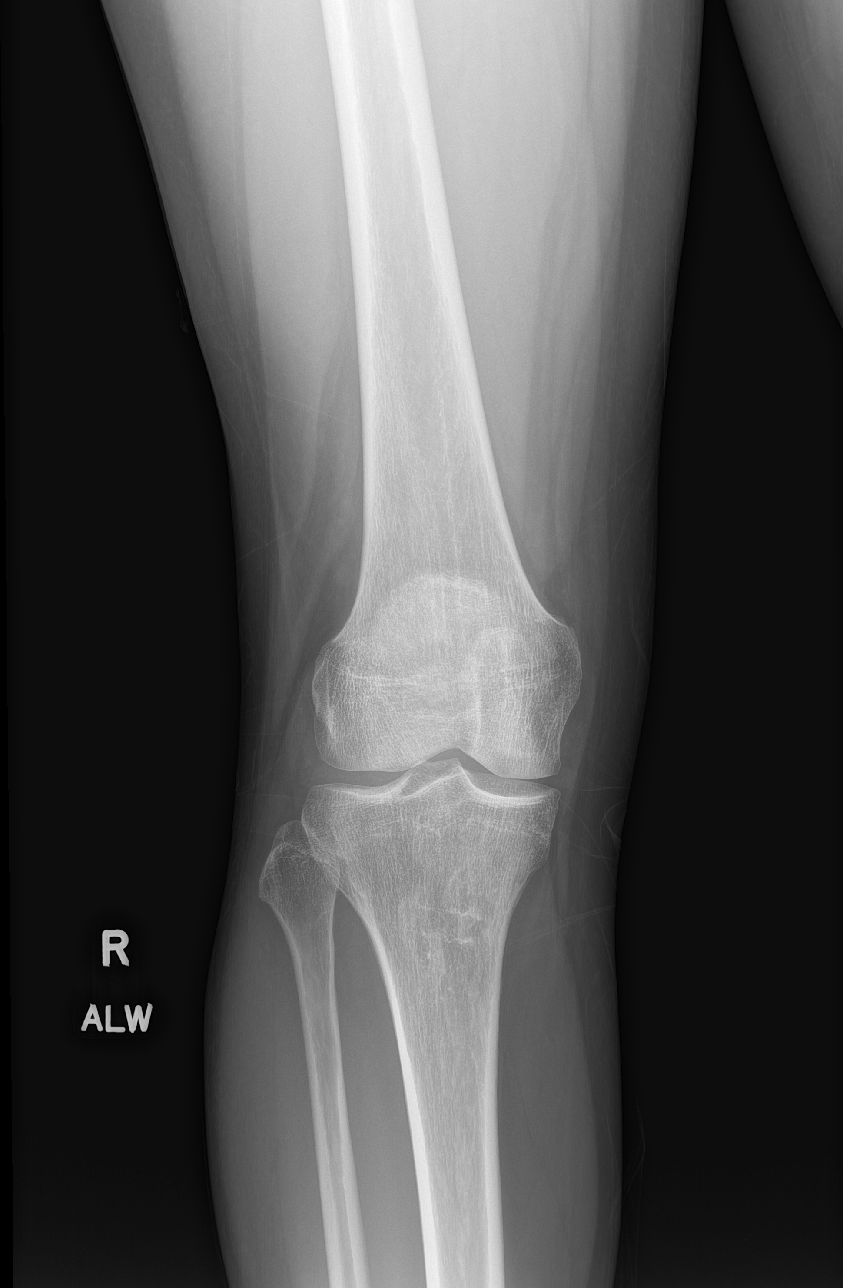

[knee obl (1 of 2)]
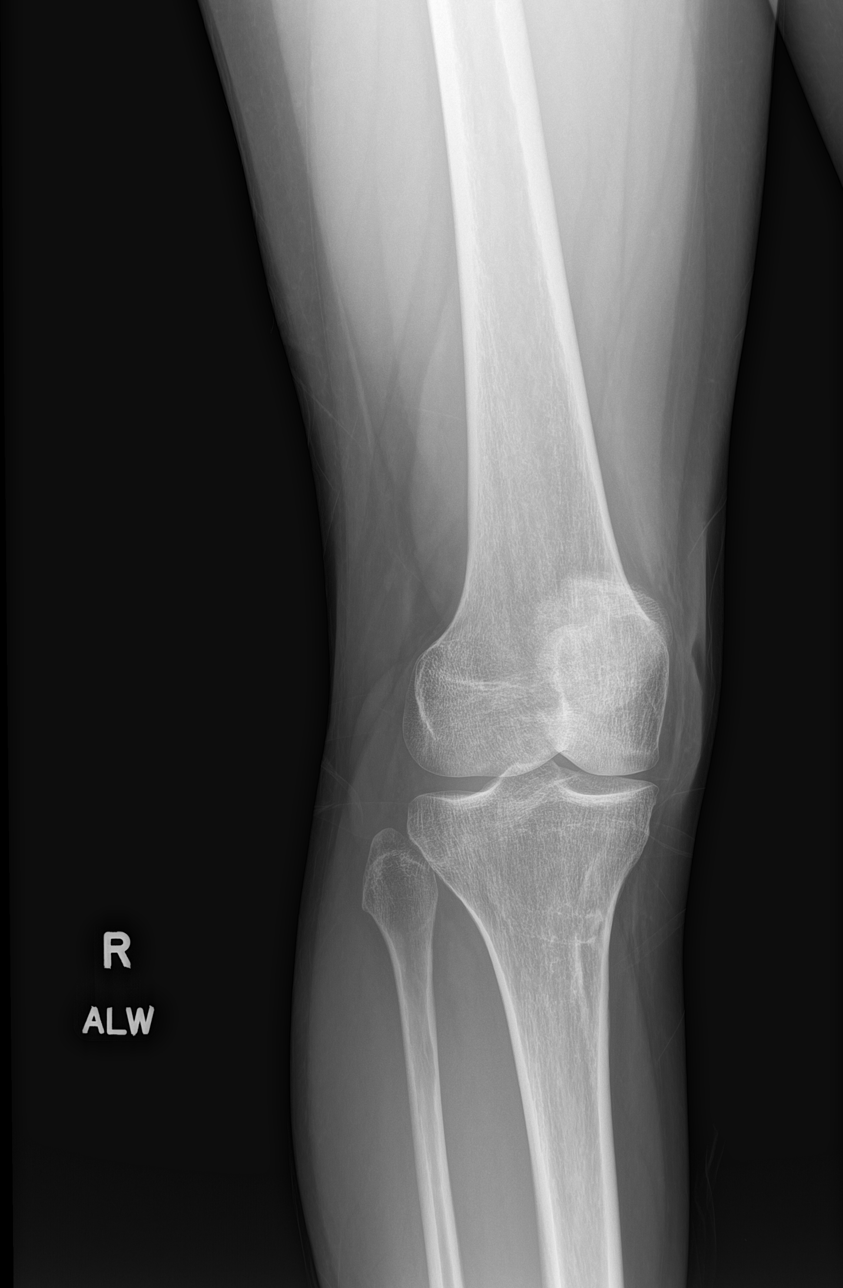

[knee obl (2 of 2)]
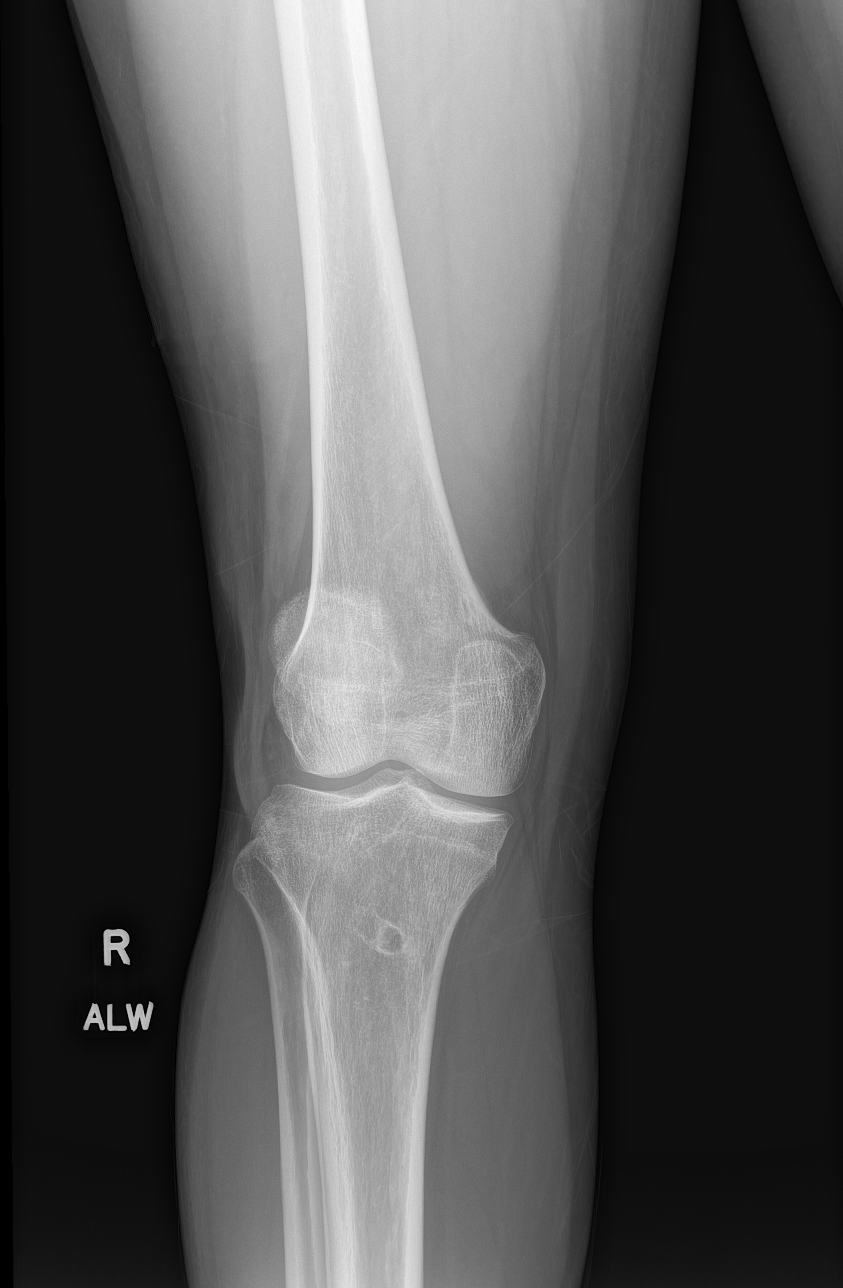

[knee lat]
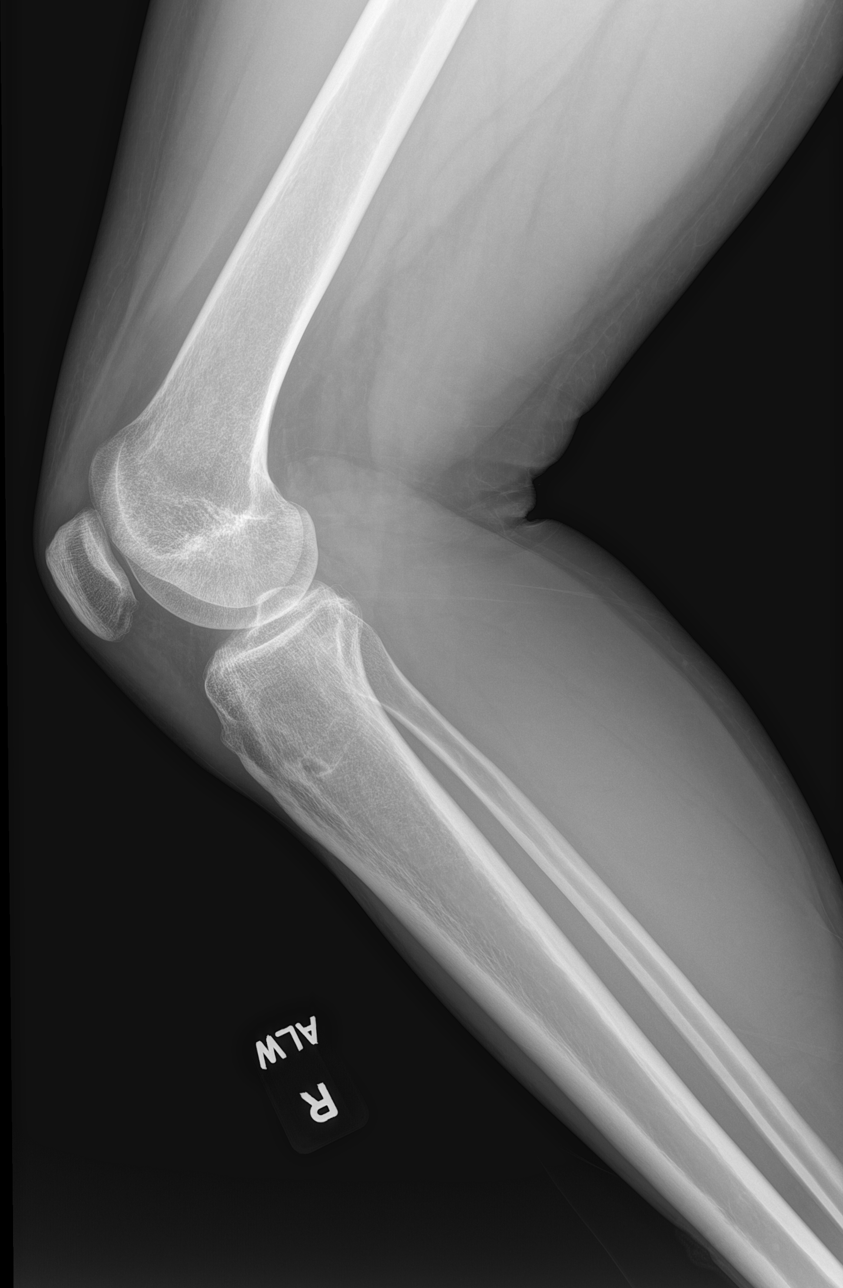

[4 of 4 positions shown; findings below may reference images not displayed]

FINDINGS: No fracture or dislocation of the bilateral knees. Joint spaces are
well preserved. No knee joint effusion. Prior right tibial
osteotomy. Soft tissues are unremarkable.
IMPRESSION: No fracture or dislocation of the bilateral knees. Joint spaces are
well preserved. No knee joint effusion.

## 2021-10-04 IMAGING — DX DG KNEE COMPLETE 4+V*L*
4 series · 4 of 4 positions shown · non-contrast
Comparison: [DATE]

CLINICAL DATA: Bilateral knee pain for 4 days, no known injury

EXAM:
RIGHT KNEE - COMPLETE 4+ VIEW; LEFT KNEE - COMPLETE 4+ VIEW

[knee ap]
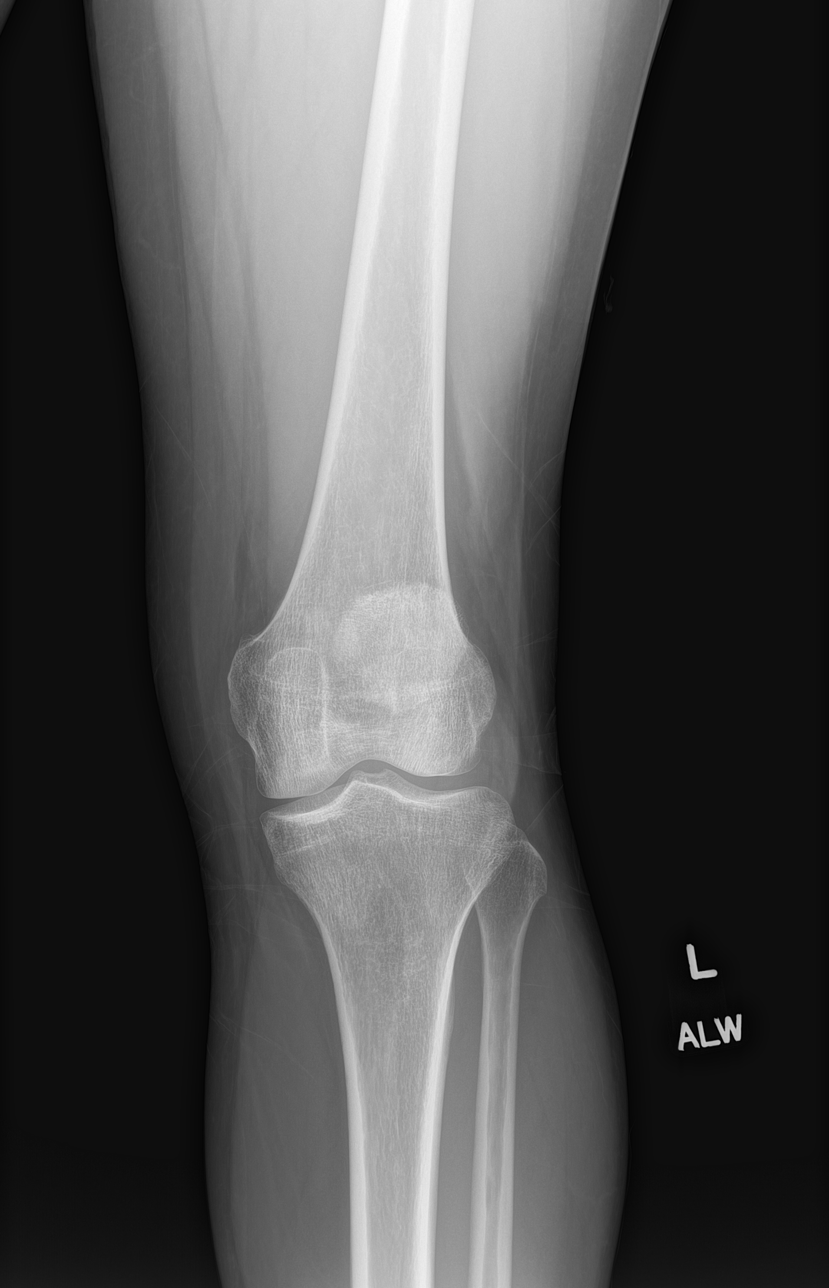

[knee obl (1 of 2)]
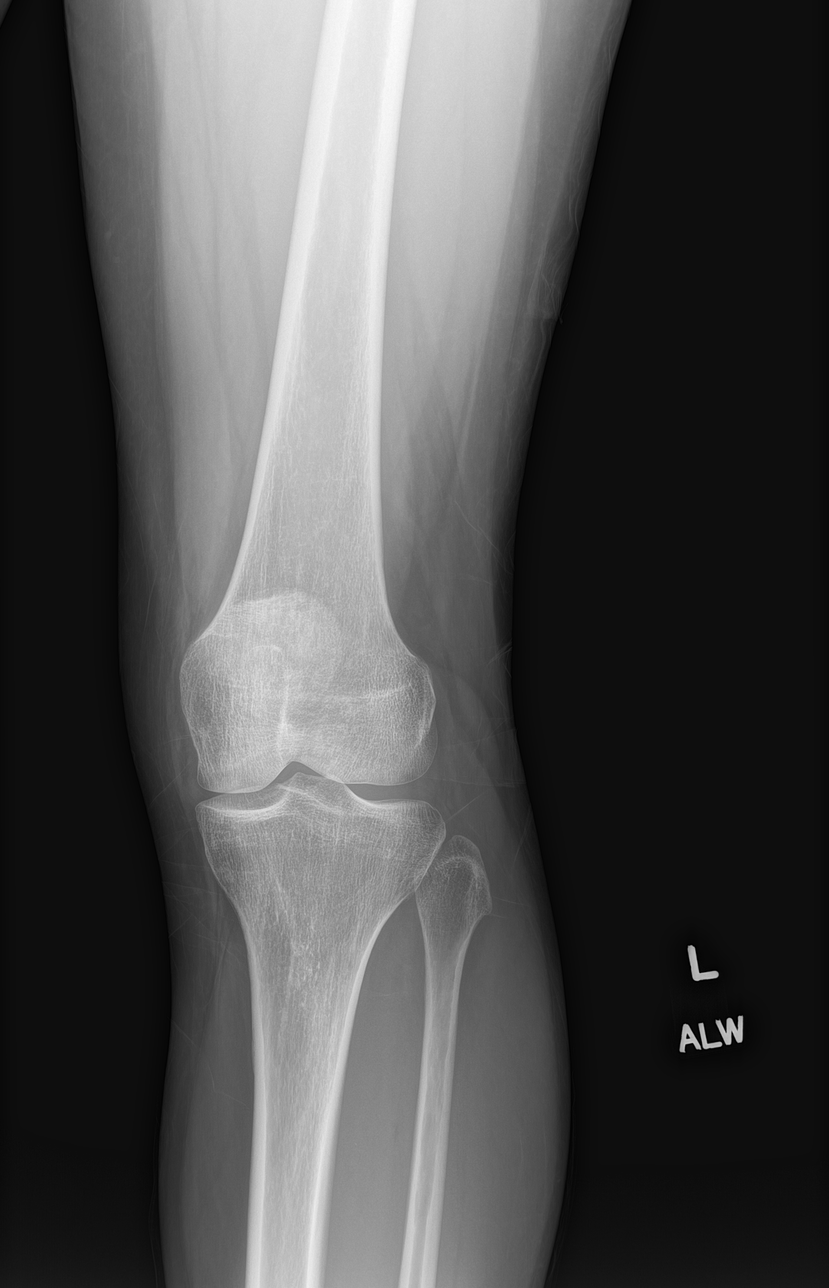

[knee obl (2 of 2)]
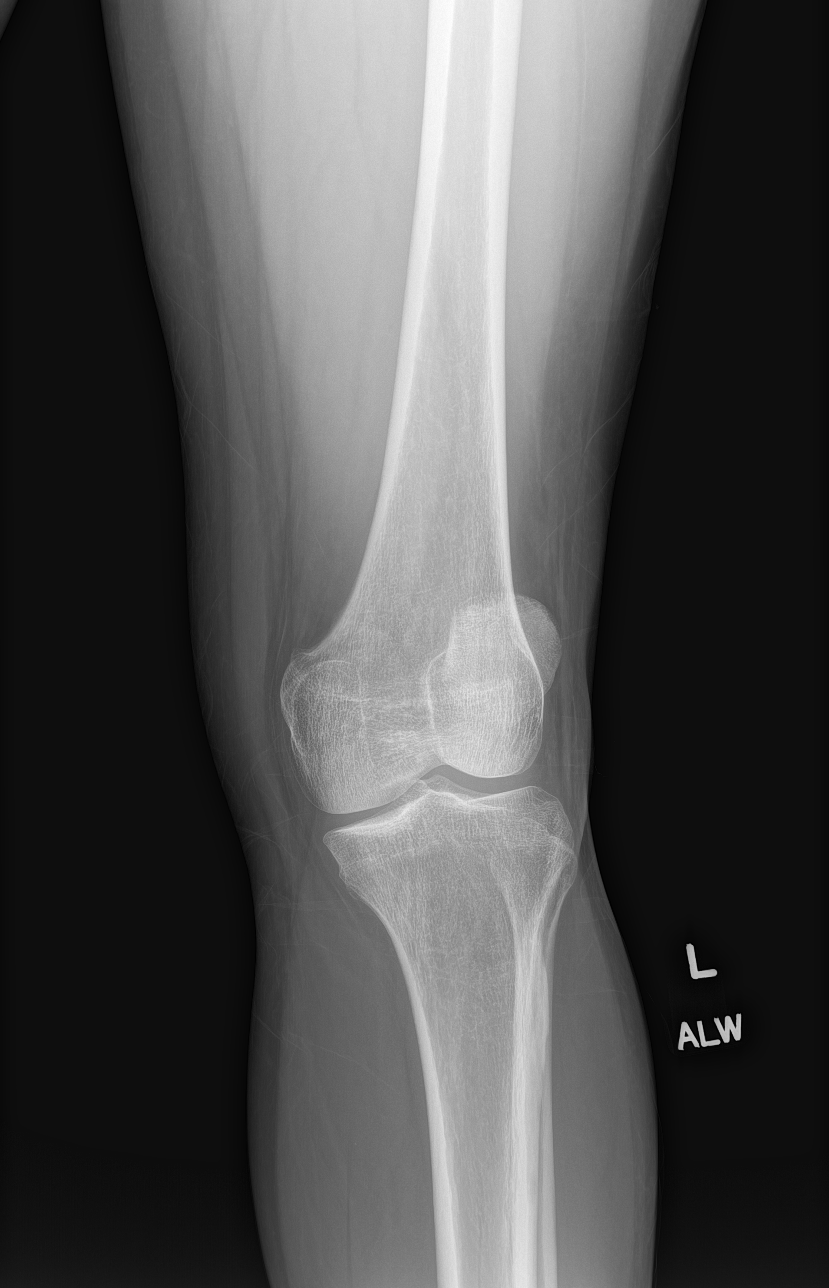

[knee lat]
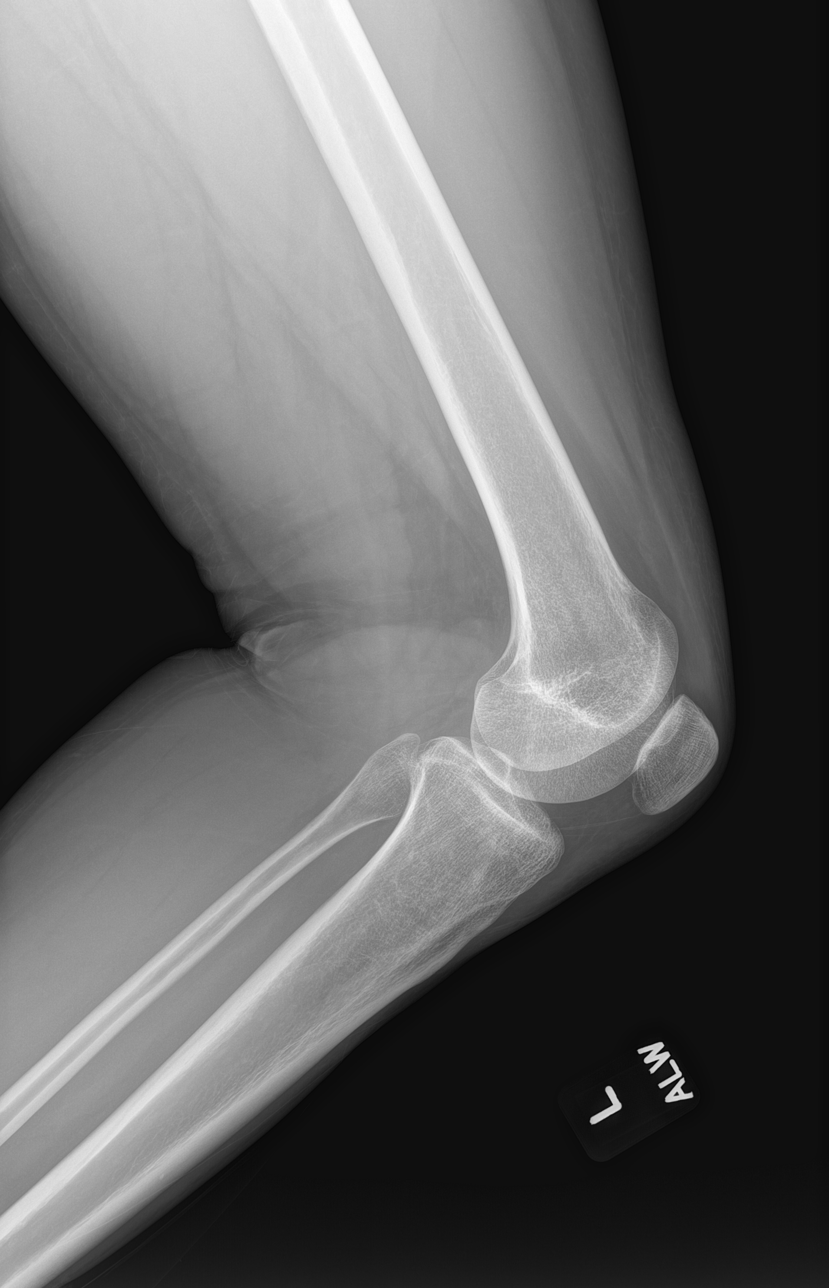

[4 of 4 positions shown; findings below may reference images not displayed]

FINDINGS: No fracture or dislocation of the bilateral knees. Joint spaces are
well preserved. No knee joint effusion. Prior right tibial
osteotomy. Soft tissues are unremarkable.
IMPRESSION: No fracture or dislocation of the bilateral knees. Joint spaces are
well preserved. No knee joint effusion.

## 2021-10-04 MED ORDER — IBUPROFEN 800 MG PO TABS: 800.0000 mg | ORAL_TABLET | Freq: Three times a day (TID) | ORAL | 0 refills | Status: AC

## 2021-10-04 MED ORDER — DICLOFENAC SODIUM 1 % EX GEL: 2.0000 g | Freq: Four times a day (QID) | CUTANEOUS | 0 refills | Status: AC

## 2021-10-04 NOTE — ED Notes (Signed)
EMT-P provided AVS using Teachback Method. Patient verbalizes understanding of Discharge Instructions. Opportunity for Questioning and Answers were provided by EMT-P. Patient Discharged from ED.  ? ?

## 2021-10-04 NOTE — ED Provider Notes (Signed)
[facility] EMERGENCY DEPT Provider Note   CSN: [id number] Arrival date & time: 10/04/21  1013     History  Chief Complaint  Patient presents with   Knee Pain    Deborah Owens is a 36 y.o. female.  Patient is a 36 year old female who presents today with complaints of bilateral knee pain.  She states that she has a history of same, has been seen by orthopedic surgery 6 months ago who gave her a cortisone shot with minimal relief.  She states that her pain has been worse in the last 4 to 5 days.  Denies any specific injury or recent overuse, for does state that she has a job that requires her to be on her feet a lot.  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  States that her pain now is similar to then.  She has been trying Tylenol and ibuprofen for pain with minimal relief.  States that she used to have knee braces which gave her significant management of her pain, however she lost these.  Pain is located in the front of her knee.  She denies any history of blood clots, any recent surgeries, any recent travel.  Also denies any IVDU.  Upon orthopedic surgery chart review, it appears that patient had similar presentation to their office 6 months ago.  It was suspected that patient had a meniscus tear, she was offered MRI or cortisone shot, she opted to have the steroid injection.  Was also given Voltaren cream.  The history is provided by the patient. No language interpreter was used.  Knee Pain Associated symptoms: no fever       Home Medications Prior to Admission medications   Medication Sig Start Date End Date Taking? Authorizing Provider  albuterol (VENTOLIN HFA) 108 (90 Base) MCG/ACT inhaler Inhale 2 puffs into the lungs every 4 (four) hours as needed for wheezing or shortness of breath (coughing fits). 01/28/21   Garnet Sierras, DO  azelastine (OPTIVAR) 0.05 % ophthalmic solution Place 1 drop into both eyes 2  (two) times daily as needed (itchy/watery eyes). 04/16/20   Garnet Sierras, DO  betamethasone dipropionate 0.05 % cream Apply  a small amount to affected area twice a day 12/10/19   [provider]  desonide (DESOWEN) 0.05 % ointment Apply ointment sparingly to affected areas twice daily as needed to the face and/or neck. 02/02/21   Garnet Sierras, DO  EPINEPHrine (EPIPEN 2-PAK) 0.3 mg/0.3 mL IJ SOAJ injection Inject 0.3 mg into the muscle once as needed for up to 1 dose for anaphylaxis. 01/28/21   Garnet Sierras, DO  fluticasone (FLONASE) 50 MCG/ACT nasal spray Place 1 spray into both nostrils in the morning and at bedtime. 01/28/21   Garnet Sierras, DO  hydrOXYzine (ATARAX/VISTARIL) 10 MG tablet SMARTSIG:1-3 Tablet(s) By Mouth PRN 12/10/19   [provider]  levocetirizine (XYZAL) 5 MG tablet Take 1 tablet (5 mg total) by mouth every evening. 01/28/21   Garnet Sierras, DO  methocarbamol (ROBAXIN) 500 MG tablet Take 500 mg by mouth 2 (two) times daily. 03/18/20   [provider]  nitrofurantoin, macrocrystal-monohydrate, (MACROBID) 100 MG capsule Take 1 capsule (100 mg total) by mouth 2 (two) times daily. 03/03/21   Brunetta Jeans, PA-C  triamcinolone ointment (KENALOG) 0.1 % Apply 1 application topically 2 (two) times daily as needed (rash). Do not use on the face, neck, armpits  or groin area. Do not use more than 3 weeks in a row. 01/28/21   Garnet Sierras, DO  valACYclovir (VALTREX) 500 MG tablet Take 500 mg by mouth daily. 02/24/20   [provider]      Allergies    Amoxicillin    Review of Systems   Review of Systems  Constitutional:  Negative for chills and fever.  Respiratory:  Negative for shortness of breath.   Gastrointestinal:  Negative for nausea and vomiting.  Musculoskeletal:  Positive for arthralgias.  All other systems reviewed and are negative.  Physical Exam Updated Vital Signs BP (!) 147/105 (BP Location: Right Arm)    Pulse 71    Temp 98.6 F (37 C) (Oral)     Resp 16    Ht 5\' 5"  (1.651 m)    Wt 65.8 kg    SpO2 100%    BMI 24.13 kg/m  Physical Exam Vitals and nursing note reviewed.  Constitutional:      Appearance: Normal appearance. She is normal weight.     Comments: Patient resting comfortably in bed in no acute distress  HENT:     Head: Normocephalic and atraumatic.  Eyes:     Pupils: Pupils are equal, round, and reactive to light.  Cardiovascular:     Rate and Rhythm: Normal rate.  Pulmonary:     Effort: Pulmonary effort is normal. No respiratory distress.  Musculoskeletal:     Cervical back: Normal range of motion and neck supple.     Right lower leg: Normal.     Left lower leg: Normal.     Comments: Tenderness noted to palpation of bilateral anterior legs specifically on the patella.  No effusion noted.  No obvious deformity, erythema, discoloration, or warmth.  No tenderness noted to popliteal pop fossa.  DP and PT pulses intact and 2+ bilaterally.  Full passive ROM intact with some pain.  No laxity of ligaments noted.  Well-healed vertical surgical scar noted to anterior right knee.  Neurological:     Mental Status: She is alert.    ED Results / Procedures / Treatments   Labs (all labs ordered are listed, but only abnormal results are displayed) Labs Reviewed - No data to display  EKG None  Radiology DG Knee Complete 4 Views Left  Result Date: 10/04/2021 CLINICAL DATA:  Bilateral knee pain for 4 days, no known injury EXAM: RIGHT KNEE - COMPLETE 4+ VIEW; LEFT KNEE - COMPLETE 4+ VIEW COMPARISON:  03/09/2021 FINDINGS: No fracture or dislocation of the bilateral knees. Joint spaces are well preserved. No knee joint effusion. Prior right tibial osteotomy. Soft tissues are unremarkable. IMPRESSION: No fracture or dislocation of the bilateral knees. Joint spaces are well preserved. No knee joint effusion. Electronically Signed   By: Delanna Ahmadi M.D.   On: 10/04/2021 11:28   DG Knee Complete 4 Views Right  Result Date:  10/04/2021 CLINICAL DATA:  Bilateral knee pain for 4 days, no known injury EXAM: RIGHT KNEE - COMPLETE 4+ VIEW; LEFT KNEE - COMPLETE 4+ VIEW COMPARISON:  03/09/2021 FINDINGS: No fracture or dislocation of the bilateral knees. Joint spaces are well preserved. No knee joint effusion. Prior right tibial osteotomy. Soft tissues are unremarkable. IMPRESSION: No fracture or dislocation of the bilateral knees. Joint spaces are well preserved. No knee joint effusion. Electronically Signed   By: Delanna Ahmadi M.D.   On: 10/04/2021 11:28    Procedures Procedures    Medications Ordered in ED Medications - No data  to display  ED Course/ Medical Decision Making/ A&P                           Medical Decision Making Amount and/or Complexity of Data Reviewed Radiology: ordered.   Patient presents today with bilateral knee pain.  Same has been bothering her for the past 4 to 5 days, however seems to be a chronic problem which she has been dealing with intermittently for many years.  She states that her pain is similar to when it has been present in the past.  She has seen orthopedic surgery who suspected she had a meniscus tear.  She has an appointment with her orthopedic surgeon in 3 weeks.  I, Lavonna Rua, PA-C, personally reviewed and evaluated these image results supported by medical decision making  X-ray imaging of bilateral knees reveals no acute fractures, dislocations or effusion.  No pain noted to the popliteal fossa of either knee, no effusion, erythema, deformity, or warmth.  Extremely low suspicion for DVT, septic arthritis, gout, or any other emergent concerns.  She is afebrile, nontoxic-appearing, and in no acute distress.  She is able to ambulate normally.  Suspect that patient's symptoms are related to her chronic knee pain.  She is in agreement.  Will recommend ibuprofen for pain with Voltaren cream and bracing.  I have also given instructions for ice and rest.  She is understanding and  amenable with plan, educated on red flag symptoms of prompt immediate return.  Discharged in stable condition.  Final Clinical Impression(s) / ED Diagnoses Final diagnoses:  Chronic pain of both knees    Rx / DC Orders ED Discharge Orders          Ordered    ibuprofen (ADVIL) 800 MG tablet  3 times daily        10/04/21 1303    diclofenac Sodium (VOLTAREN) 1 % GEL  4 times daily        10/04/21 1303          An After Visit Summary was printed and given to the patient.     Nestor Lewandowsky 10/04/21 1305    Gareth Morgan, MD 10/05/21 [phone]

## 2021-10-04 NOTE — Discharge Instructions (Addendum)
As we discussed, your imaging was negative for any acute abnormalities.  I suspect that your pain is related to your chronic knee pain.  I have given you braces in the ER today for support.  I also recommend taking ibuprofen as needed for pain and using Voltaren gel for additional relief.  I have given you prescriptions for these.  I also recommend rest, ice, and elevation of your knees for additional relief.  Please follow-up with your orthopedic surgeon at your appointment in 3 weeks.  Return if development of any new or worsening symptoms.

## 2021-10-04 NOTE — ED Triage Notes (Signed)
Pt arrives to ED with c/o bilateral knee pain. Started x4 days ago. The pain has worsened and is described as throbbing. No known injury to knee over past week. Pain is not relieved with Advil or Tylenol.

## 2021-11-22 NOTE — Progress Notes (Signed)
? ?Follow Up Note ? ?RE: Deborah Owens MRN: [id number] DOB: [birth year] ?Date of Office Visit: 11/23/2021 ? ?Referring provider: Dian Queen, MD ?Primary care provider: Dian Queen, MD ? ?Chief Complaint: Allergic Rhinitis  (Itchy water eyes, runny nose, sneezing ), Other (Would like to restart dupixent ), and Sinus Problem (Sinus pressure and headaches ) ? ?History of Present Illness: ?I had the pleasure of seeing Deborah Owens for a follow up visit at the [facility] on 11/23/2021. She is a 36 y.o. female, who is being followed for allergic rhinoconjunctivitis, oral allergy syndrome, shortness of breath, atopic dermatitis. Her previous allergy office visit was on 01/28/2021 with Dr. Maudie Mercury. Today is a regular follow up visit. ? ?Seasonal and perennial allergic rhinoconjunctivitis ?Patient wants to restart allergy injections in the [place] office as she works there but lives in [place]. ?Currently ran out of medications and now having issues with itchy eyes, sinus pressure, sneezing, nasal congestion, coughing. ? ?Shortness of breath ?Resolved. ?Used albuterol once 2 months ago with good benefit. ? ?Atopic dermatitis ?Patient was doing self injections at home with [facility] which was helping. ?Last injection was in January. ?No eczema flares but noted some itchy/dry patch around her eyes lately. ? ?Assessment and Plan: ?Deborah Owens is a 36 y.o. female with: ?Seasonal and perennial allergic rhinoconjunctivitis ?Past history - started AIT on 02/04/2020 (G-W-T and RW-DM-C-D-Horse-CR). Stopped AIT in November 2021 ?Interim history - ran out of meds and having flares, interested in restarting AIT in the [place] office.  ?Restart allergy injections - make appointment for 3 weeks in the [place] office.  ?See below for environmental control measures.  ?Use Xyzal (levocetirizine) daily as needed. May take twice a day during allergy flares.  ?May use Flonase (fluticasone) nasal spray 1 spray per nostril twice a day  as needed for nasal congestion.  ?May use azelastine eye drops 1 drop in each eye twice a day as needed for itchy/watery eyes.  ? ?Pollen-food allergy ?Continue to avoid fruits that are bothersome. ?For mild symptoms you can take over the counter antihistamines such as Benadryl and monitor symptoms closely. If symptoms worsen or if you have severe symptoms including breathing issues, throat closure, significant swelling, whole body hives, severe diarrhea and vomiting, lightheadedness then inject epinephrine and seek immediate medical care afterwards. ? ?Shortness of breath ?Past history - 2022 normal spirometry. ?Interim history - last used albuterol 2 months ago. ?May use albuterol rescue inhaler 2 puffs every 4 to 6 hours as needed for shortness of breath, chest tightness, coughing, and wheezing. Monitor frequency of use.  ? ?Atopic dermatitis ?Past history - Mometasone burns. ?Interim history - started Dupixent in June 2022. Stopped in January 2023 with no major flares. Skin is doing much better.  ?Continue skin care as below. ?Hold Dupixent injections. ?If eczema worsens then can restart.  ?May use desonide 0.05% ointment sparingly to affected areas twice daily as needed to the face and/or neck. Care is to be taken to avoid the eyes. ?Use triamcinolone 0.1% ointment twice a day as needed for eczema flares. Do not use on the face, neck, armpits or groin area. Do not use more than 3 weeks in a row.  ?May use hydroxyzine 10mg  as needed for itching.  ? ?Return in about 4 months (around 03/25/2022). ? ?Meds ordered this encounter  ?Medications  ? levocetirizine (XYZAL) 5 MG tablet  ?  Sig: Take 1 tablet (5 mg total) by mouth every evening.  ?  Dispense:  30 tablet  ?  Refill:  5  ? fluticasone (FLONASE) 50 MCG/ACT nasal spray  ?  Sig: Place 1 spray into both nostrils in the morning and at bedtime.  ?  Dispense:  16 g  ?  Refill:  5  ? azelastine (OPTIVAR) 0.05 % ophthalmic solution  ?  Sig: Place 1 drop into both eyes  2 (two) times daily as needed (itchy/watery eyes).  ?  Dispense:  6 mL  ?  Refill:  5  ? hydrOXYzine (ATARAX) 10 MG tablet  ?  Sig: Take 1 tablet (10 mg total) by mouth daily as needed.  ?  Dispense:  30 tablet  ?  Refill:  5  ? desonide (DESOWEN) 0.05 % ointment  ?  Sig: Apply 1 application. topically 2 (two) times daily as needed (mild rash flare). Okay to use on the face, neck, groin area. Do not use more than 1 week at a time.  ?  Dispense:  60 g  ?  Refill:  2  ? EPINEPHrine 0.3 mg/0.3 mL IJ SOAJ injection  ?  Sig: Inject 0.3 mg into the muscle as needed for anaphylaxis.  ?  Dispense:  1 each  ?  Refill:  2  ?  May dispense generic/Mylan/Teva brand.  ? ?Lab Orders  ?No laboratory test(s) ordered today  ? ? ?Diagnostics: ?None.  ? ?Medication List:  ?Current Outpatient Medications  ?Medication Sig Dispense Refill  ? albuterol (VENTOLIN HFA) 108 (90 Base) MCG/ACT inhaler Inhale 2 puffs into the lungs every 4 (four) hours as needed for wheezing or shortness of breath (coughing fits). 18 g 1  ? desonide (DESOWEN) 0.05 % ointment Apply 1 application. topically 2 (two) times daily as needed (mild rash flare). Okay to use on the face, neck, groin area. Do not use more than 1 week at a time. 60 g 2  ? diclofenac (VOLTAREN) 75 MG EC tablet Take 1 tablet (75 mg total) by mouth 2 (two) times daily. 30 tablet 2  ? diclofenac Sodium (VOLTAREN) 1 % GEL Apply 2 g topically 4 (four) times daily. 150 g 0  ? EPINEPHrine 0.3 mg/0.3 mL IJ SOAJ injection Inject 0.3 mg into the muscle as needed for anaphylaxis. 1 each 2  ? ibuprofen (ADVIL) 800 MG tablet Take 1 tablet (800 mg total) by mouth 3 (three) times daily. 21 tablet 0  ? methocarbamol (ROBAXIN) 500 MG tablet Take 500 mg by mouth 2 (two) times daily.    ? nitrofurantoin, macrocrystal-monohydrate, (MACROBID) 100 MG capsule Take 1 capsule (100 mg total) by mouth 2 (two) times daily. 10 capsule 0  ? triamcinolone ointment (KENALOG) 0.1 % Apply 1 application topically 2 (two)  times daily as needed (rash). Do not use on the face, neck, armpits or groin area. Do not use more than 3 weeks in a row. 30 g 2  ? valACYclovir (VALTREX) 500 MG tablet Take 500 mg by mouth daily.    ? azelastine (OPTIVAR) 0.05 % ophthalmic solution Place 1 drop into both eyes 2 (two) times daily as needed (itchy/watery eyes). 6 mL 5  ? fluticasone (FLONASE) 50 MCG/ACT nasal spray Place 1 spray into both nostrils in the morning and at bedtime. 16 g 5  ? hydrOXYzine (ATARAX) 10 MG tablet Take 1 tablet (10 mg total) by mouth daily as needed. 30 tablet 5  ? levocetirizine (XYZAL) 5 MG tablet Take 1 tablet (5 mg total) by mouth every evening. 30 tablet 5  ? ?Current Facility-Administered  Medications  ?Medication Dose Route Frequency Provider Last Rate Last Admin  ? dupilumab (DUPIXENT) prefilled syringe 600 mg  600 mg Subcutaneous Q14 Days Garnet Sierras, DO   600 mg at 02/11/21 1717  ? ?Allergies: ?Allergies  ?Allergen Reactions  ? Amoxicillin Nausea And Vomiting and Other (See Comments)  ? ?I reviewed her past medical history, social history, family history, and environmental history and no significant changes have been reported from her previous visit. ? ?Review of Systems  ?Constitutional:  Negative for appetite change, chills, fever and unexpected weight change.  ?HENT:  Positive for congestion. Negative for rhinorrhea and sneezing.   ?Eyes:  Positive for itching.  ?Respiratory:  Negative for cough, chest tightness, shortness of breath and wheezing.   ?Cardiovascular:  Negative for chest pain.  ?Gastrointestinal:  Negative for abdominal pain.  ?Genitourinary:  Negative for difficulty urinating.  ?Skin:  Positive for rash.  ?Allergic/Immunologic: Positive for environmental allergies and food allergies.  ?Neurological:  Negative for headaches.  ? ?Objective: ?BP 120/76   Pulse 69   Temp 97.9 ?F (36.6 ?C)   Resp 18   Ht 5\' 5"  (1.651 m)   Wt 148 lb 6.4 oz (67.3 kg)   SpO2 99%   BMI 24.70 kg/m?  ?Body mass index is  24.7 kg/m?Marland Kitchen ?Physical Exam ?Vitals and nursing note reviewed.  ?Constitutional:   ?   Appearance: Normal appearance. She is well-developed.  ?HENT:  ?   Head: Normocephalic and atraumatic.  ?   Right Ear: Tympa

## 2021-11-23 ENCOUNTER — Encounter: Payer: Self-pay | Admitting: Orthopaedic Surgery

## 2021-11-23 ENCOUNTER — Encounter: Payer: Self-pay | Admitting: Allergy

## 2021-11-23 ENCOUNTER — Other Ambulatory Visit: Payer: Self-pay

## 2021-11-23 ENCOUNTER — Ambulatory Visit (INDEPENDENT_AMBULATORY_CARE_PROVIDER_SITE_OTHER): Payer: 59 | Admitting: Orthopaedic Surgery

## 2021-11-23 ENCOUNTER — Ambulatory Visit: Payer: 59 | Admitting: Allergy

## 2021-11-23 VITALS — BP 120/76 | HR 69 | Temp 97.9°F | Resp 18 | Ht 65.0 in | Wt 148.4 lb

## 2021-11-23 DIAGNOSIS — J302 Other seasonal allergic rhinitis: Secondary | ICD-10-CM

## 2021-11-23 DIAGNOSIS — G8929 Other chronic pain: Secondary | ICD-10-CM

## 2021-11-23 DIAGNOSIS — L2089 Other atopic dermatitis: Secondary | ICD-10-CM

## 2021-11-23 DIAGNOSIS — M25562 Pain in left knee: Secondary | ICD-10-CM

## 2021-11-23 DIAGNOSIS — R0602 Shortness of breath: Secondary | ICD-10-CM | POA: Diagnosis not present

## 2021-11-23 DIAGNOSIS — H1010 Acute atopic conjunctivitis, unspecified eye: Secondary | ICD-10-CM

## 2021-11-23 DIAGNOSIS — T781XXD Other adverse food reactions, not elsewhere classified, subsequent encounter: Secondary | ICD-10-CM

## 2021-11-23 DIAGNOSIS — M25561 Pain in right knee: Secondary | ICD-10-CM

## 2021-11-23 DIAGNOSIS — H1013 Acute atopic conjunctivitis, bilateral: Secondary | ICD-10-CM

## 2021-11-23 MED ORDER — DESONIDE 0.05 % EX OINT: 1.0000 "application " | TOPICAL_OINTMENT | Freq: Two times a day (BID) | CUTANEOUS | 2 refills | Status: AC | PRN

## 2021-11-23 MED ORDER — LEVOCETIRIZINE DIHYDROCHLORIDE 5 MG PO TABS: 5.0000 mg | ORAL_TABLET | Freq: Every evening | ORAL | 5 refills | Status: AC

## 2021-11-23 MED ORDER — EPINEPHRINE 0.3 MG/0.3ML IJ SOAJ: 0.3000 mg | INTRAMUSCULAR | 2 refills | Status: AC | PRN

## 2021-11-23 MED ORDER — HYDROXYZINE HCL 10 MG PO TABS: 10.0000 mg | ORAL_TABLET | Freq: Every day | ORAL | 5 refills | Status: AC | PRN

## 2021-11-23 MED ORDER — AZELASTINE HCL 0.05 % OP SOLN: 1.0000 [drp] | Freq: Two times a day (BID) | OPHTHALMIC | 5 refills | Status: AC | PRN

## 2021-11-23 MED ORDER — DICLOFENAC SODIUM 75 MG PO TBEC: 75.0000 mg | DELAYED_RELEASE_TABLET | Freq: Two times a day (BID) | ORAL | 2 refills | Status: AC

## 2021-11-23 MED ORDER — FLUTICASONE PROPIONATE 50 MCG/ACT NA SUSP: 1.0000 | Freq: Two times a day (BID) | NASAL | 5 refills | Status: AC

## 2021-11-23 NOTE — Assessment & Plan Note (Signed)
Past history - started AIT on 02/04/2020 (G-W-T and RW-DM-C-D-Horse-CR). Stopped AIT in November 2021 ?Interim history - ran out of meds and having flares, interested in restarting AIT in the [place] office.  ?? Restart allergy injections - make appointment for 3 weeks in the [place] office.  ?? See below for environmental control measures.  ?? Use Xyzal (levocetirizine) daily as needed. May take twice a day during allergy flares.  ?? May use Flonase (fluticasone) nasal spray 1 spray per nostril twice a day as needed for nasal congestion.  ?? May use azelastine eye drops 1 drop in each eye twice a day as needed for itchy/watery eyes.  ?

## 2021-11-23 NOTE — Assessment & Plan Note (Signed)
Past history - 2022 normal spirometry. ?Interim history - last used albuterol 2 months ago. ?? May use albuterol rescue inhaler 2 puffs every 4 to 6 hours as needed for shortness of breath, chest tightness, coughing, and wheezing. Monitor frequency of use.  ?

## 2021-11-23 NOTE — Assessment & Plan Note (Addendum)
Past history - Mometasone burns. ?Interim history - started Dupixent in June 2022. Stopped in January 2023 with no major flares. Skin is doing much better.  ?? Continue skin care as below. ?? Hold Dupixent injections. ?? If eczema worsens then can restart.  ?? May use desonide 0.05% ointment sparingly to affected areas twice daily as needed to the face and/or neck. Care is to be taken to avoid the eyes. ?? Use triamcinolone 0.1% ointment twice a day as needed for eczema flares. Do not use on the face, neck, armpits or groin area. Do not use more than 3 weeks in a row.  ?? May use hydroxyzine 10mg  as needed for itching.  ?

## 2021-11-23 NOTE — Assessment & Plan Note (Signed)
?   Continue to avoid fruits that are bothersome.  For mild symptoms you can take over the counter antihistamines such as Benadryl and monitor symptoms closely. If symptoms worsen or if you have severe symptoms including breathing issues, throat closure, significant swelling, whole body hives, severe diarrhea and vomiting, lightheadedness then inject epinephrine and seek immediate medical care afterwards. 

## 2021-11-23 NOTE — Progress Notes (Signed)
? ?Office Visit Note ?  ?Patient: Deborah Owens           ?Date of Birth: Oct 03, 1985           ?MRN: [id number] ?Visit Date: 11/23/2021 ?             ?Requested by: Marcelle Overlie, MD ?[address] ?[address] ?[place],  [place] [address] ?PCP: Marcelle Overlie, MD ? ? ?Assessment & Plan: ?Visit Diagnoses:  ?1. Chronic pain of both knees   ? ? ?Plan: Ms. Hausmann returns today for follow-up of bilateral knee pain.  Worse on the left.  We saw her in July of last year.  Injection helped temporarily.  Denies any injuries.  Occasional swelling.  Takes naproxen. ? ?Examination of bilateral knees is unchanged. ? ?Due to ongoing bilateral knee pain despite cortisone injection we will need an MRI to rule out structural abnormalities.  Follow-up after the MRI. ? ?Follow-Up Instructions: No follow-ups on file.  ? ?Orders:  ?No orders of the defined types were placed in this encounter. ? ?Meds ordered this encounter  ?Medications  ? diclofenac (VOLTAREN) 75 MG EC tablet  ?  Sig: Take 1 tablet (75 mg total) by mouth 2 (two) times daily.  ?  Dispense:  30 tablet  ?  Refill:  2  ? ? ? ? Procedures: ?No procedures performed ? ? ?Clinical Data: ?No additional findings. ? ? ?Subjective: ?Chief Complaint  ?Patient presents with  ? Right Knee - Pain  ? Left Knee - Pain  ? ? ?HPI ? ?Review of Systems  ?Constitutional: Negative.   ?HENT: Negative.    ?Eyes: Negative.   ?Respiratory: Negative.    ?Cardiovascular: Negative.   ?Endocrine: Negative.   ?Musculoskeletal: Negative.   ?Neurological: Negative.   ?Hematological: Negative.   ?Psychiatric/Behavioral: Negative.    ?All other systems reviewed and are negative. ? ? ?Objective: ?Vital Signs: There were no vitals taken for this visit. ? ?Physical Exam ?Vitals and nursing note reviewed.  ?Constitutional:   ?   Appearance: She is well-developed.  ?Pulmonary:  ?   Effort: Pulmonary effort is normal.  ?Skin: ?   General: Skin is warm.  ?   Capillary Refill: Capillary refill takes less than  2 seconds.  ?Neurological:  ?   Mental Status: She is alert and oriented to person, place, and time.  ?Psychiatric:     ?   Behavior: Behavior normal.     ?   Thought Content: Thought content normal.     ?   Judgment: Judgment normal.  ? ? ?Ortho Exam ? ?Specialty Comments:  ?No specialty comments available. ? ?Imaging: ?No results found. ? ? ?PMFS History: ?Patient Active Problem List  ? Diagnosis Date Noted  ? Chronic pain of both knees 11/23/2021  ? Shortness of breath 01/28/2021  ? Seasonal and perennial allergic rhinoconjunctivitis 01/14/2020  ? Atopic dermatitis 01/14/2020  ? 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 ?Past Medical History:  ?Diagnosis Date  ? Allergy   ? Atopic dermatitis 01/14/2020  ? Heart murmur   ?  ?Family History  ?Problem Relation Age of Onset  ? Allergic rhinitis Mother   ? Eczema Neg Hx   ? Urticaria Neg Hx   ? Asthma Neg Hx   ?  ?Past Surgical History:  ?Procedure Laterality Date  ? KNEE SURGERY    ? ?Social History  ? ?Occupational History  ? Occupation: Waitress  ?  Employer: [employer]   ?Tobacco Use  ? Smoking  status: Every Day  ?  Types: Cigarettes  ? Smokeless tobacco: Never  ?Vaping Use  ? Vaping Use: Never used  ?Substance and Sexual Activity  ? Alcohol use: Yes  ?  Comment: Socially  ? Drug use: No  ? Sexual activity: Not on file  ? ? ? ? ? ? ?

## 2021-11-23 NOTE — Patient Instructions (Addendum)
Allergic rhino conjunctivitis ?Restart allergy injections - make appointment for 3 weeks in the [place] office.  ?See below for environmental control measures.  ?Use Xyzal (levocetirizine) daily as needed. May take twice a day during allergy flares.  ?May use Flonase (fluticasone) nasal spray 1 spray per nostril twice a day as needed for nasal congestion.  ?May use azelastine eye drops 1 drop in each eye twice a day as needed for itchy/watery eyes.  ? ?Atopic dermatitis ?Continue skin care as below. ?Hold Dupixent injections. ?If eczema worsens we can restart.  ?May use desonide 0.05% ointment sparingly to affected areas twice daily as needed to the face and/or neck. Care is to be taken to avoid the eyes. ?Use triamcinolone 0.1% ointment twice a day as needed for eczema flares. Do not use on the face, neck, armpits or groin area. Do not use more than 3 weeks in a row.  ?May use hydroxyzine 10mg  daily as needed for itching.  ?  ?Pollen-food allergy syndrome ?Continue to avoid fruits that are bothersome. ?For mild symptoms you can take over the counter antihistamines such as Benadryl and monitor symptoms closely. If symptoms worsen or if you have severe symptoms including breathing issues, throat closure, significant swelling, whole body hives, severe diarrhea and vomiting, lightheadedness then inject epinephrine and seek immediate medical care afterwards. ? ?Breathing: ?May use albuterol rescue inhaler 2 puffs every 4 to 6 hours as needed for shortness of breath, chest tightness, coughing, and wheezing. Monitor frequency of use.  ? ?Follow up in 4 months or sooner if needed. ? ?Skin care recommendations ? ?Bath time: ?Always use lukewarm water. AVOID very hot or cold water. ?Keep bathing time to 5-10 minutes. ?Do NOT use bubble bath. ?Use a mild soap and use just enough to wash the dirty areas. ?Do NOT scrub skin vigorously.  ?After bathing, pat dry your skin with a towel. Do NOT rub or scrub the  skin. ? ?Moisturizers and prescriptions:  ?ALWAYS apply moisturizers immediately after bathing (within 3 minutes). This helps to lock-in moisture. ?Use the moisturizer several times a day over the whole body. ?Good summer moisturizers include: Aveeno, CeraVe, Cetaphil. ?Good winter moisturizers include: Aquaphor, Vaseline, Cerave, Cetaphil, Eucerin, Vanicream. ?When using moisturizers along with medications, the moisturizer should be applied about one hour after applying the medication to prevent diluting effect of the medication or moisturize around where you applied the medications. When not using medications, the moisturizer can be continued twice daily as maintenance. ? ?Laundry and clothing: ?Avoid laundry products with added color or perfumes. ?Use unscented hypo-allergenic laundry products such as Tide free, Cheer free & gentle, and All free and clear.  ?If the skin still seems dry or sensitive, you can try double-rinsing the clothes. ?Avoid tight or scratchy clothing such as wool. ?Do not use fabric softeners or dyer sheets. ? ?Reducing Pollen Exposure ?Pollen seasons: trees (spring), grass (summer) and ragweed/weeds (fall). ?Keep windows closed in your home and car to lower pollen exposure.  ?Install air conditioning in the bedroom and throughout the house if possible.  ?Avoid going out in dry windy days - especially early morning. ?Pollen counts are highest between 5 - 10 AM and on dry, hot and windy days.  ?Save outside activities for late afternoon or after a heavy rain, when pollen levels are lower.  ?Avoid mowing of grass if you have grass pollen allergy. ?Be aware that pollen can also be transported indoors on people and pets.  ?Dry your clothes in an automatic  dryer rather than hanging them outside where they might collect pollen.  ?Rinse hair and eyes before bedtime. ?Control of House Dust Mite Allergen ?Dust mite allergens are a common trigger of allergy and asthma symptoms. While they can be found  throughout the house, these microscopic creatures thrive in warm, humid environments such as bedding, upholstered furniture and carpeting. ?Because so much time is spent in the bedroom, it is essential to reduce mite levels there.  ?Encase pillows, mattresses, and box springs in special allergen-proof fabric covers or airtight, zippered plastic covers.  ?Bedding should be washed weekly in hot water (130? F) and dried in a hot dryer. Allergen-proof covers are available for comforters and pillows that can?t be regularly washed.  ?Wash the allergy-proof covers every few months. Minimize clutter in the bedroom. Keep pets out of the bedroom.  ?Keep humidity less than 50% by using a dehumidifier or air conditioning. You can buy a humidity measuring device called a hygrometer to monitor this.  ?If possible, replace carpets with hardwood, linoleum, or washable area rugs. If that's not possible, vacuum frequently with a vacuum that has a HEPA filter. ?Remove all upholstered furniture and non-washable window drapes from the bedroom. ?Remove all non-washable stuffed toys from the bedroom.  Wash stuffed toys weekly. ?Pet Allergen Avoidance: ?Contrary to popular opinion, there are no ?hypoallergenic? breeds of dogs or cats. That is because people are not allergic to an animal?s hair, but to an allergen found in the animal's saliva, dander (dead skin flakes) or urine. Pet allergy symptoms typically occur within minutes. For some people, symptoms can build up and become most severe 8 to 12 hours after contact with the animal. People with severe allergies can experience reactions in public places if dander has been transported on the pet owners? clothing. ?Keeping an animal outdoors is only a partial solution, since homes with pets in the yard still have higher concentrations of animal allergens. ?Before getting a pet, ask your allergist to determine if you are allergic to animals. If your pet is already considered part of your  family, try to minimize contact and keep the pet out of the bedroom and other rooms where you spend a great deal of time. ?As with dust mites, vacuum carpets often or replace carpet with a hardwood floor, tile or linoleum. ?High-efficiency particulate air (HEPA) cleaners can reduce allergen levels over time. ?While dander and saliva are the source of cat and dog allergens, urine is the source of allergens from rabbits, hamsters, mice and [place] pigs; so ask a non-allergic family member to clean the animal?s cage. ?If you have a pet allergy, talk to your allergist about the potential for allergy immunotherapy (allergy shots). This strategy can often provide long-term relief. ?Cockroach Allergen Avoidance ?Cockroaches are often found in the homes of densely populated urban areas, schools or commercial buildings, but these creatures can lurk almost anywhere. This does not mean that you have a dirty house or living area. ?Block all areas where roaches can enter the home. This includes crevices, wall cracks and windows.  ?Cockroaches need water to survive, so fix and seal all leaky faucets and pipes. Have an exterminator go through the house when your family and pets are gone to eliminate any remaining roaches. ?Keep food in lidded containers and put pet food dishes away after your pets are done eating. Vacuum and sweep the floor after meals, and take out garbage and recyclables. Use lidded garbage containers in the kitchen. Wash dishes immediately after use  and clean under stoves, refrigerators or toasters where crumbs can accumulate. Wipe off the stove and other kitchen surfaces and cupboards regularly. ? ? ?

## 2021-11-25 ENCOUNTER — Telehealth: Payer: Self-pay | Admitting: *Deleted

## 2021-11-25 NOTE — Telephone Encounter (Signed)
Received fax from insurance stating that a PA was not needed since this was a formulary medication. Form has been labeled and placed in bulk scanning.  ?

## 2021-11-25 NOTE — Telephone Encounter (Signed)
PA has been submitted through CoverMyMeds for Azelastine eye drops and is currently pending approval/denial.  

## 2021-11-30 ENCOUNTER — Telehealth: Payer: Self-pay | Admitting: *Deleted

## 2021-11-30 MED ORDER — EPINEPHRINE 0.3 MG/0.3ML IJ SOAJ: 0.3000 mg | INTRAMUSCULAR | 2 refills | Status: AC | PRN

## 2021-11-30 NOTE — Telephone Encounter (Signed)
Pt called and said she wanted to restart allergy shots- I gave her CPT codes to check with her new insurance company. She slao mentioned her Epipen would be $50 which is too expensive for her. I told her we could try sending AuviQ. Provided her with phone number to [facility].  ?

## 2021-12-02 ENCOUNTER — Telehealth: Payer: Self-pay | Admitting: Orthopaedic Surgery

## 2021-12-02 NOTE — Telephone Encounter (Signed)
Called pt and left vm for pt to call and set an MRI review appt with Dr. Roda Shutters after 4/20 ?

## 2021-12-09 ENCOUNTER — Ambulatory Visit (HOSPITAL_COMMUNITY)
Admission: RE | Admit: 2021-12-09 | Discharge: 2021-12-09 | Disposition: A | Discharge status: Home / Self Care | Payer: 59 | Source: Ambulatory Visit | Attending: Orthopaedic Surgery | Admitting: Orthopaedic Surgery

## 2021-12-09 DIAGNOSIS — M25562 Pain in left knee: Secondary | ICD-10-CM | POA: Diagnosis present

## 2021-12-09 DIAGNOSIS — G8929 Other chronic pain: Secondary | ICD-10-CM

## 2021-12-09 DIAGNOSIS — M25561 Pain in right knee: Secondary | ICD-10-CM | POA: Diagnosis present

## 2021-12-09 IMAGING — MR MR KNEE*R* W/O CM
6 series · 39 of 40 positions shown · non-contrast
Comparison: X-ray knee [DATE].

CLINICAL DATA: Ongoing right knee pain. History of right knee
surgery and patella instability.

EXAM:
MRI OF THE RIGHT KNEE WITHOUT CONTRAST
TECHNIQUE: Multiplanar, multisequence MR imaging of the knee was performed. No
intravenous contrast was administered.

[Series 5: T2 fat-sat · axial · right · 4.0mm · 0.50mm/px · z∈[-94,+46]mm · 8 of 33 slices shown (1 of 3)]
[im 1/33]
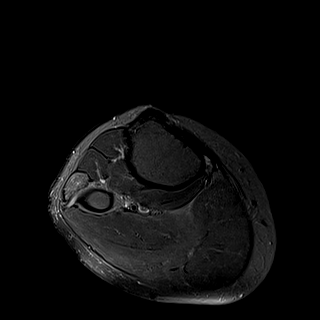
[im 5/33]
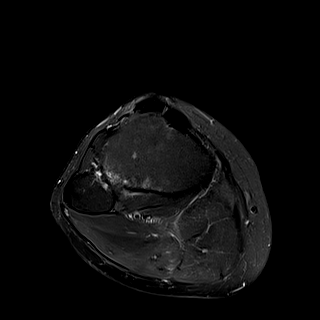
[im 10/33]
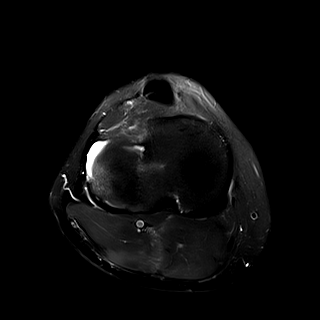
[im 14/33]
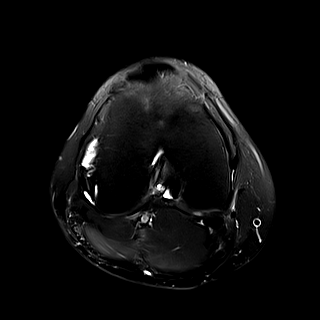
[im 19/33]
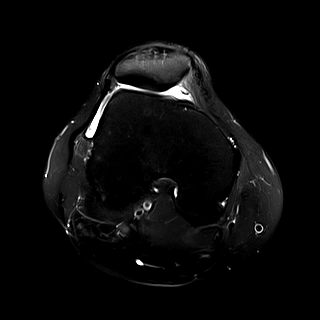
[im 23/33]
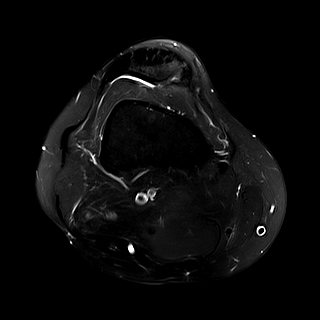
[im 28/33]
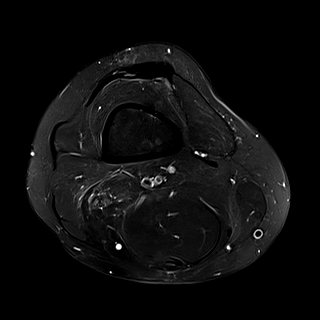
[im 33/33]
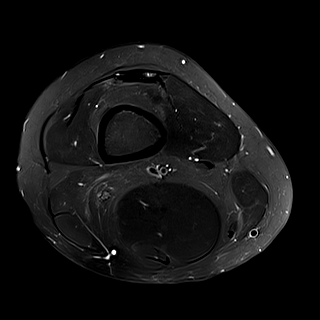

[Series 6: T1 · coronal · right · 4.0mm · 0.29mm/px · 5 of 25 slices shown]
[im 1/25]
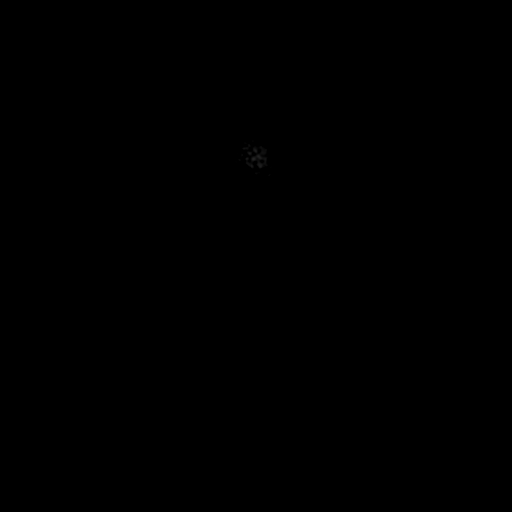
[im 5/25]
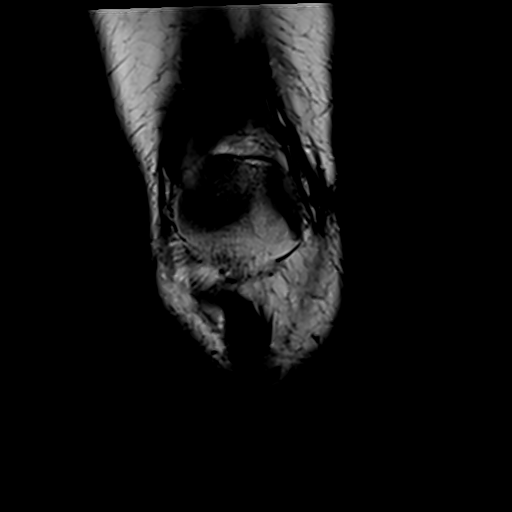
[im 10/25]
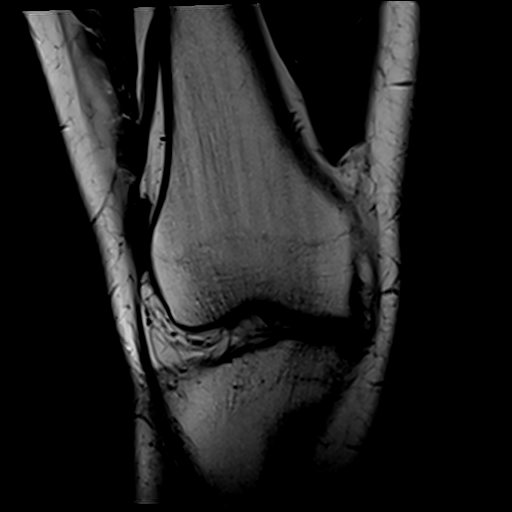
[im 15/25]
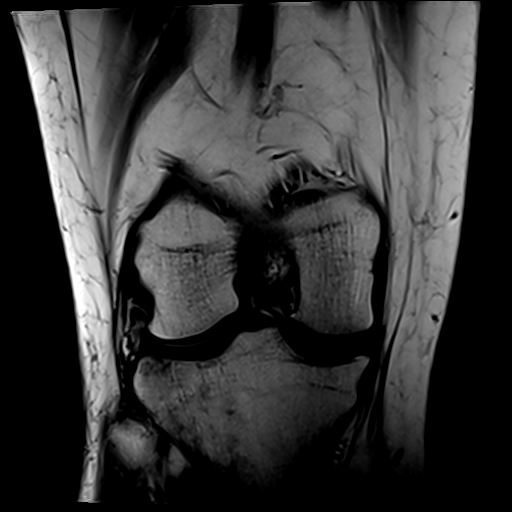
[im 20/25]
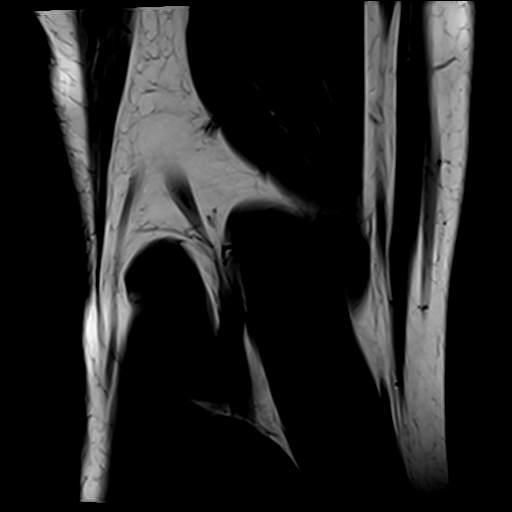

[Series 7: T2 fat-sat · coronal · right · 4.0mm · 0.59mm/px · 6 of 25 slices shown (2 of 3)]
[im 1/25]
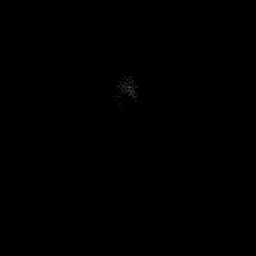
[im 5/25]
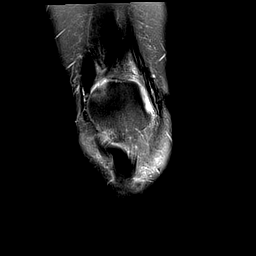
[im 10/25]
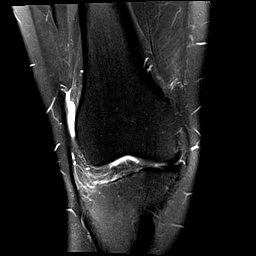
[im 15/25]
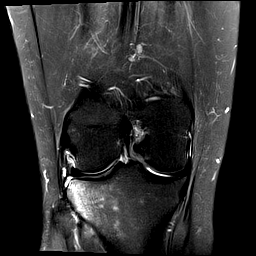
[im 20/25]
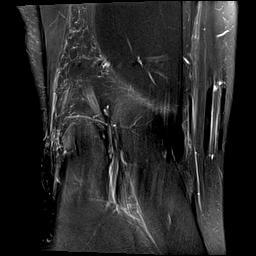
[im 25/25]
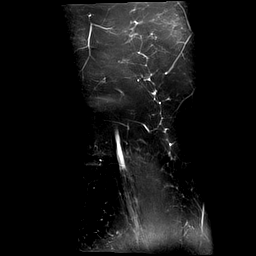

[Series 8: PD fat-sat · coronal · right · 3.0mm · 0.47mm/px · 8 of 31 slices shown (1 of 2)]
[im 1/31]
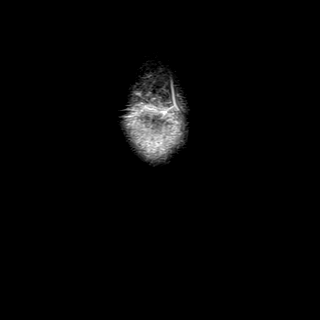
[im 5/31]
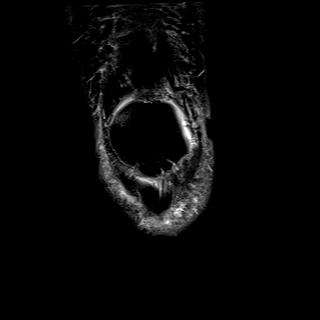
[im 9/31]
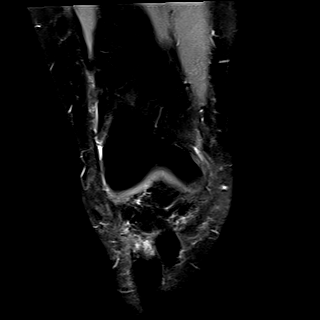
[im 13/31]
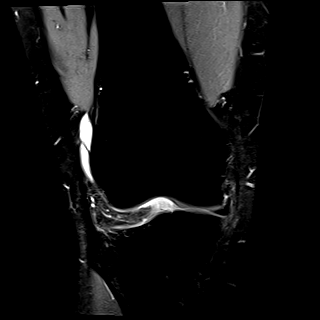
[im 18/31]
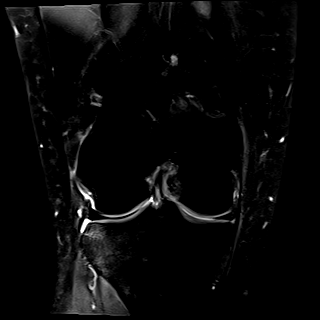
[im 22/31]
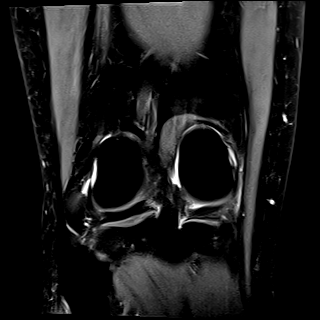
[im 26/31]
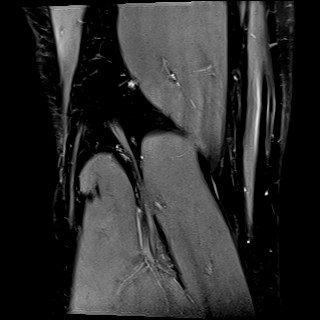
[im 31/31]
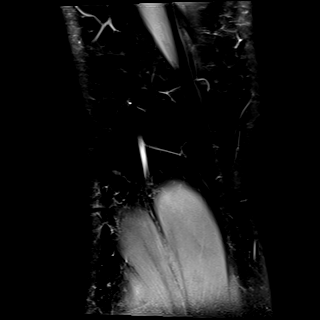

[Series 9: PD fat-sat · sagittal · right · 4.0mm · 0.47mm/px · 6 of 24 slices shown (2 of 2)]
[im 1/24]
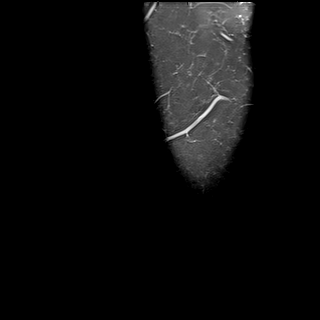
[im 5/24]
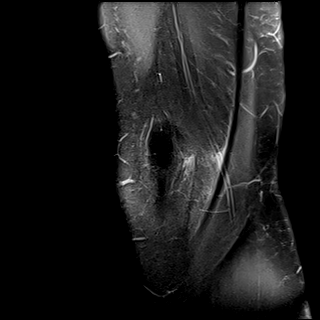
[im 10/24]
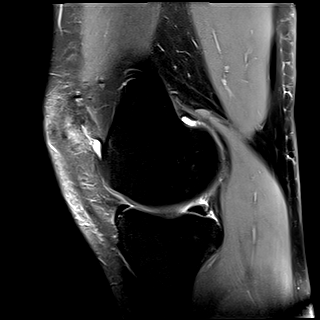
[im 14/24]
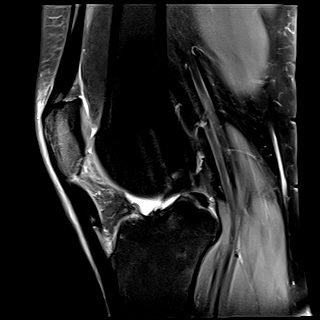
[im 19/24]
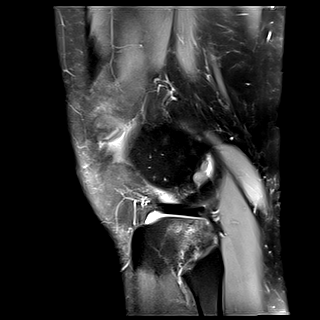
[im 24/24]
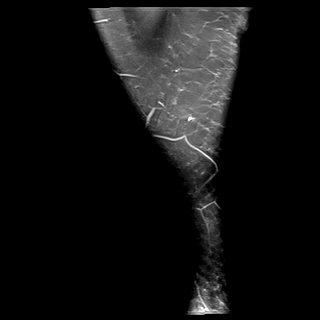

[Series 10: T2 fat-sat · sagittal · right · 4.0mm · 0.47mm/px · 6 of 24 slices shown (3 of 3)]
[im 1/24]
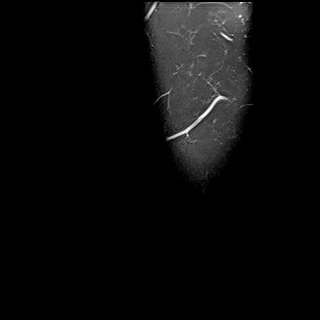
[im 5/24]
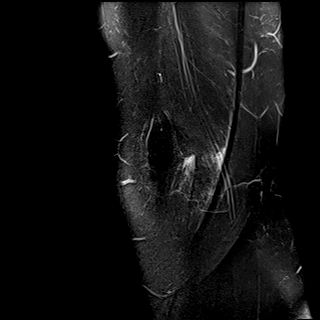
[im 10/24]
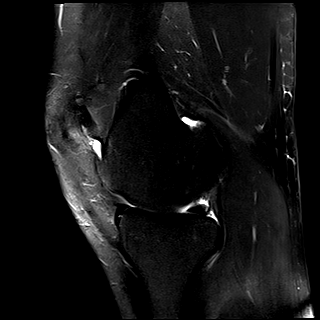
[im 14/24]
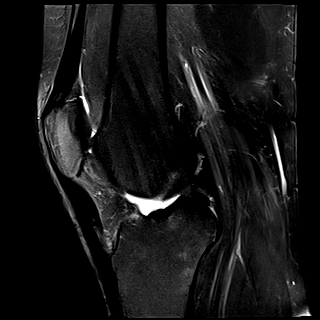
[im 19/24]
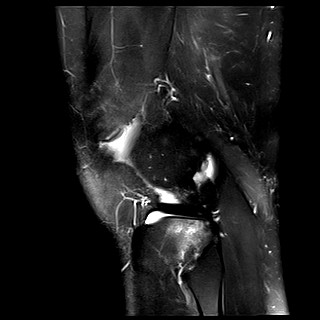
[im 24/24]
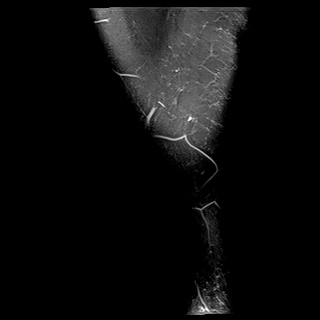

[39 of 40 positions shown; findings below may reference images not displayed]

FINDINGS: MENISCI

Medial meniscus:  Intact.

Lateral meniscus:  Intact.

LIGAMENTS

Cruciates: Intact ACL and PCL.

Collaterals: Intact MCL. Lateral collateral ligament complex intact.

CARTILAGE

Patellofemoral:  No chondral defect.

Medial:  No chondral defect.

Lateral:  No chondral defect.

MISCELLANEOUS

Joint: Trace joint effusion. Mild edema within the inferolateral
aspect of Hoffa's fat.

Popliteal Fossa:  No Baker's cyst. Intact popliteus tendon.

Extensor Mechanism: Distal quadriceps tendon intact with minimal
tendinosis distally. Postsurgical changes from prior lateral release
with patellar tendon transfer

Bones: Focal bone marrow edema at the posterior aspect of the
lateral tibial plateau with subtle nondepressed subchondral fracture
line (series 10, image 19). Osseous structures appear otherwise
intact. No additional fracture or site of bone marrow edema. No
malalignment. TT-TG distance of 9 mm.

Other: Faint intramuscular edema within the medial and lateral heads
of the gastrocnemius muscle.
IMPRESSION: 1. Focal bone marrow edema at the posterior aspect of the lateral
tibial plateau with subtle nondepressed subchondral fracture line.
2. Faint intramuscular edema within the medial and lateral heads of
the gastrocnemius muscle, which may reflect muscle strains.
3. Distal quadriceps tendon intact with minimal tendinosis distally.
4. Postsurgical changes from prior extensor mechanism realignment.
5. Trace joint effusion.

## 2021-12-09 IMAGING — MR MR KNEE*L* W/O CM
6 series · 34 of 40 positions shown · non-contrast
Comparison: X-ray knee [DATE].

CLINICAL DATA: Ongoing left knee pain with instability for 1 year.

EXAM:
MRI OF THE LEFT KNEE WITHOUT CONTRAST
TECHNIQUE: Multiplanar, multisequence MR imaging of the knee was performed. No
intravenous contrast was administered.

[Series 5: T2 fat-sat · axial · left · 4.0mm · 0.50mm/px · z∈[-117,+35]mm · 8 of 36 slices shown (1 of 3)]
[im 1/36]
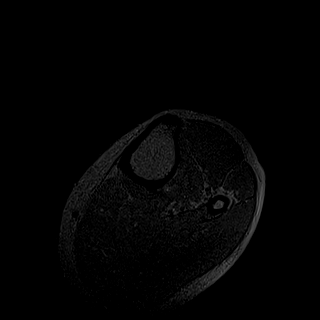
[im 4/36]
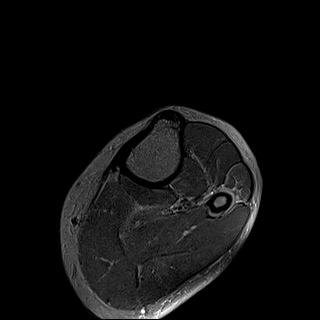
[im 12/36]
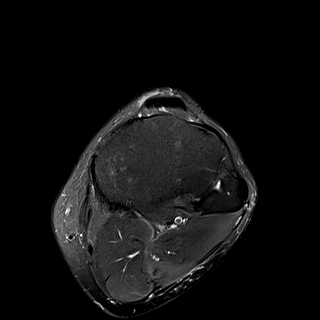
[im 16/36]
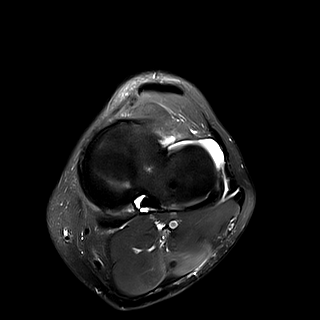
[im 20/36]
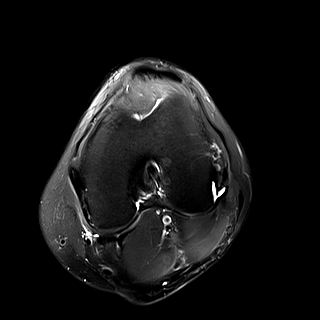
[im 24/36]
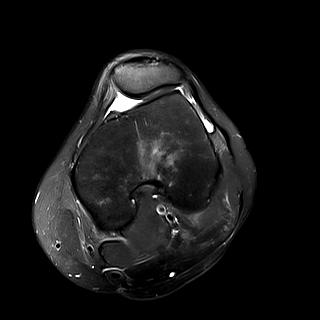
[im 32/36]
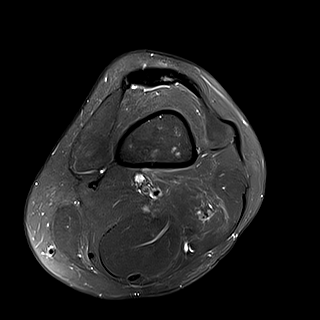
[im 36/36]
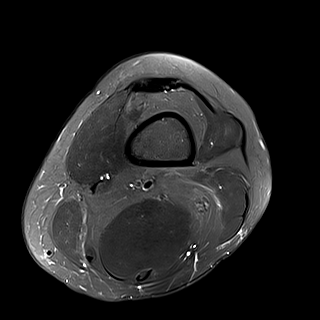

[Series 6: T1 · coronal · left · 4.0mm · 0.29mm/px · 2 of 25 slices shown]
[im 1/25]
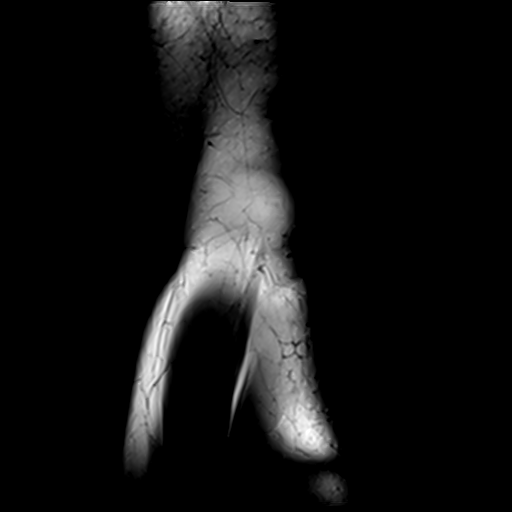
[im 5/25]
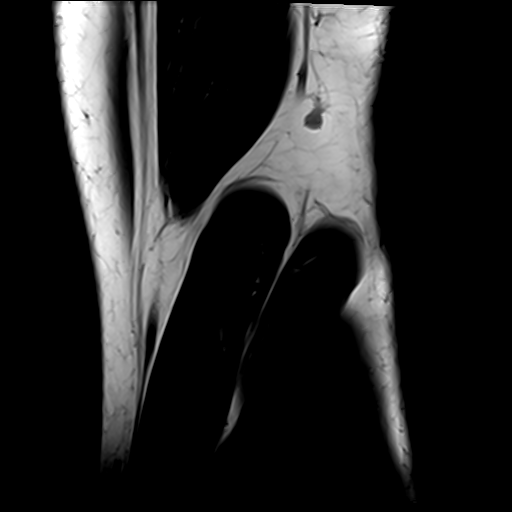

[Series 7: T2 fat-sat · coronal · left · 4.0mm · 0.29mm/px · 6 of 25 slices shown (2 of 3)]
[im 1/25]
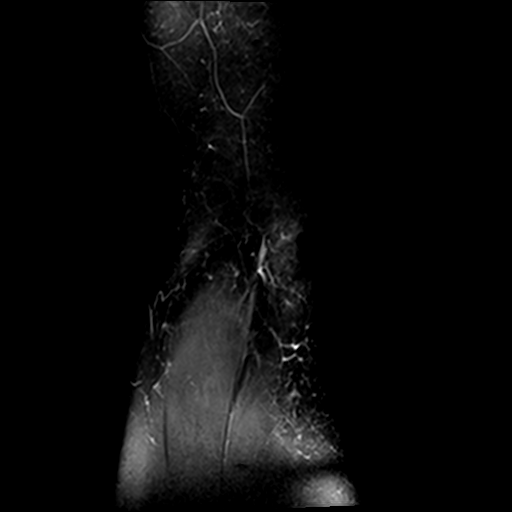
[im 5/25]
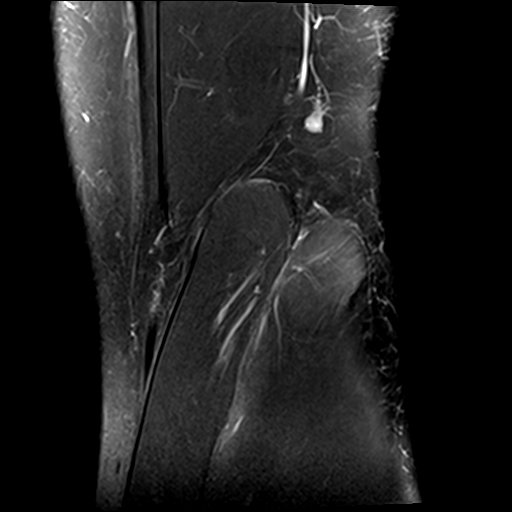
[im 10/25]
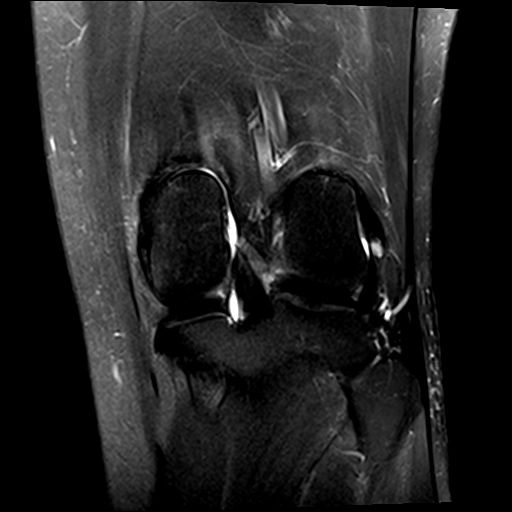
[im 15/25]
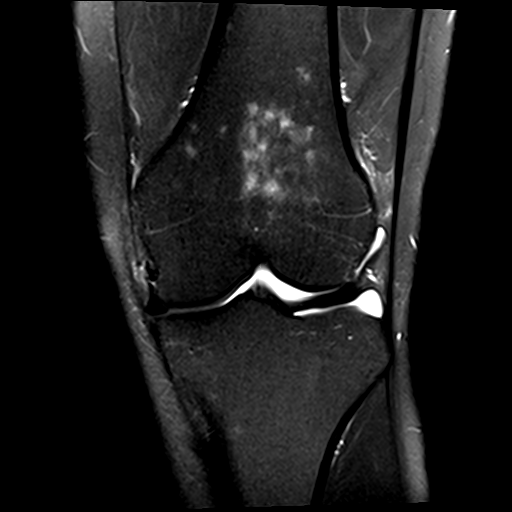
[im 20/25]
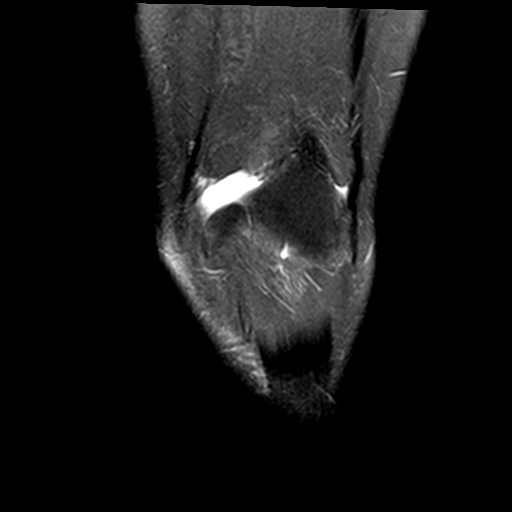
[im 25/25]
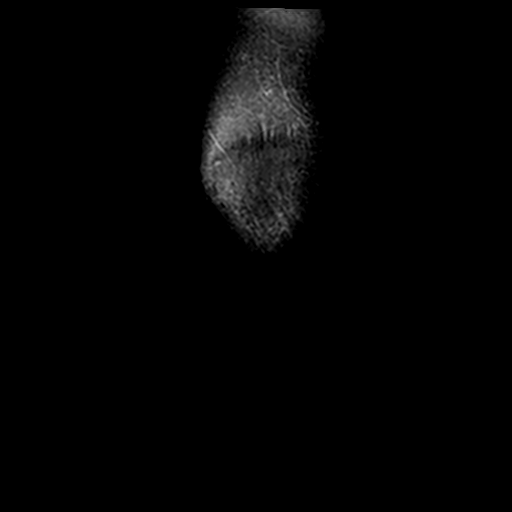

[Series 8: PD fat-sat · coronal · left · 4.0mm · 0.47mm/px · 6 of 25 slices shown (1 of 2)]
[im 1/25]
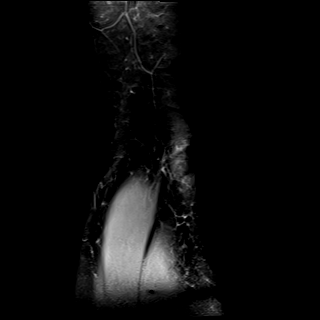
[im 5/25]
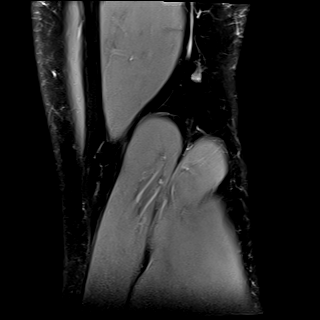
[im 10/25]
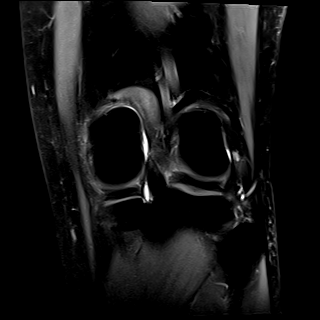
[im 15/25]
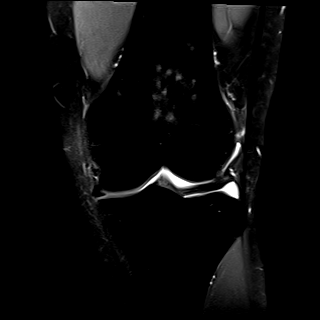
[im 20/25]
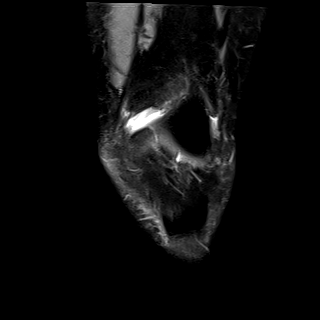
[im 25/25]
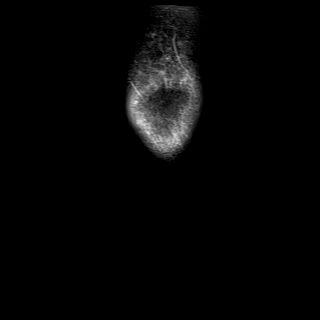

[Series 9: PD fat-sat · sagittal · left · 4.0mm · 0.47mm/px · 6 of 22 slices shown (2 of 2)]
[im 1/22]
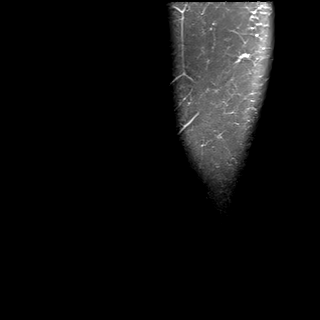
[im 5/22]
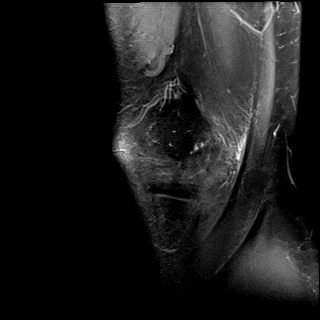
[im 9/22]
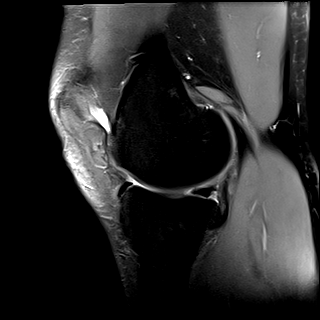
[im 13/22]
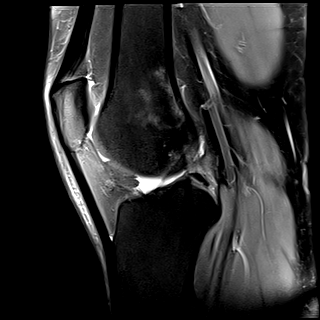
[im 17/22]
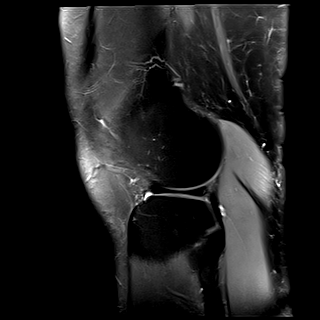
[im 22/22]
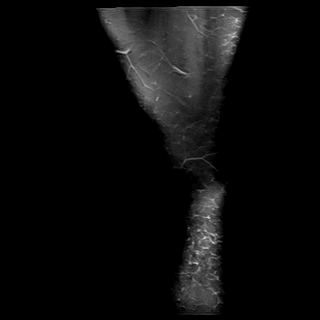

[Series 10: T2 fat-sat · sagittal · left · 4.0mm · 0.47mm/px · 6 of 22 slices shown (3 of 3)]
[im 1/22]
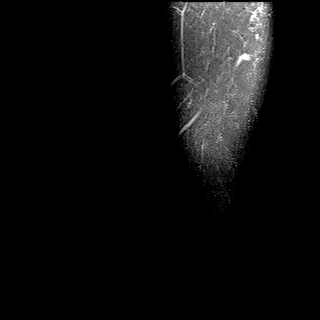
[im 5/22]
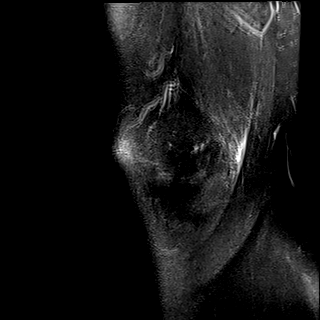
[im 9/22]
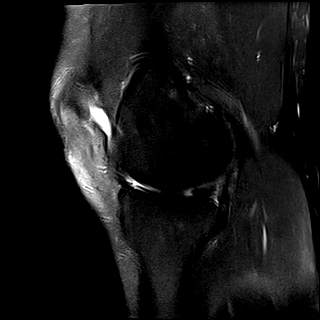
[im 13/22]
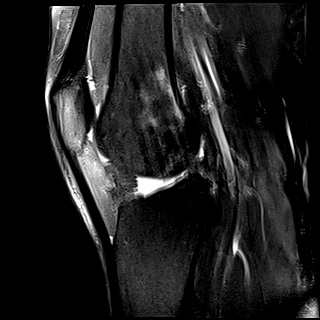
[im 17/22]
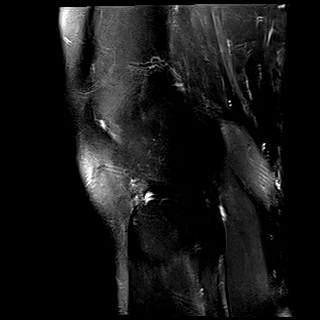
[im 22/22]
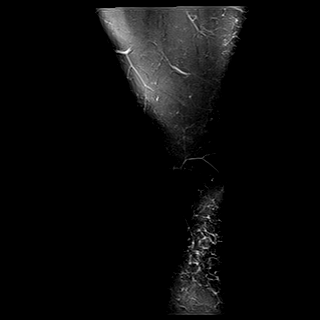

[34 of 40 positions shown; findings below may reference images not displayed]

FINDINGS: Despite efforts by the technologist and patient, motion artifact is
present on today's exam and could not be eliminated. This reduces
exam sensitivity and specificity.

MENISCI

Medial meniscus: There is mild intermediate proton density signal
throughout the peripheral third of the meniscal triangle of the mid
AP dimension of the body of the medial meniscus with mild
degenerative fraying and elongation extending superiorly (coronal
image 13).

Lateral meniscus:  Intact.

LIGAMENTS

Cruciates: The ACL and PCL are intact.

Collaterals: The medial collateral ligament is intact. The fibular
collateral ligament, biceps femoris tendon, iliotibial band, and
popliteus tendon are intact.

CARTILAGE

Patellofemoral:  Intact.

Medial:  Intact.

Lateral:  Intact.

Joint: Nojoint effusion. Normal Hoffa's fat pad. No plical
thickening.

Popliteal Fossa:  No Baker's cyst.

Extensor Mechanism:  Intact quadriceps tendon and patellar tendon.

Bones:  No acute fracture or dislocation.

Other: None.
IMPRESSION: :
IMPRESSION: 1. There is focal intermediate signal degenerative change within the
peripheral third of the meniscal triangle of the mid AP dimension of
the body of the medial meniscus with degenerative fraying and
elongation of the meniscal triangle superiorly.
2. Intact cruciate ligaments and collateral ligaments.

## 2021-12-17 ENCOUNTER — Ambulatory Visit (INDEPENDENT_AMBULATORY_CARE_PROVIDER_SITE_OTHER): Payer: 59 | Admitting: Orthopaedic Surgery

## 2021-12-17 ENCOUNTER — Encounter: Payer: Self-pay | Admitting: Orthopaedic Surgery

## 2021-12-17 DIAGNOSIS — M25562 Pain in left knee: Secondary | ICD-10-CM | POA: Diagnosis not present

## 2021-12-17 DIAGNOSIS — G8929 Other chronic pain: Secondary | ICD-10-CM | POA: Diagnosis not present

## 2021-12-17 DIAGNOSIS — M25561 Pain in right knee: Secondary | ICD-10-CM

## 2021-12-17 NOTE — Progress Notes (Signed)
? ?  Office Visit Note ?  ?Patient: Deborah Owens           ?Date of Birth: 06/02/1986           ?MRN: [id number] ?Visit Date: 12/17/2021 ?             ?Requested by: Marcelle Overlie, MD ?[address] ?[address] ?[place],  [place] [address] ?PCP: Marcelle Overlie, MD ? ? ?Assessment & Plan: ?Visit Diagnoses:  ?1. Chronic pain of both knees   ? ? ?Plan: MRI of the right knee shows edema and subchondral stress reaction of the lateral tibial plateau.  No structural abnormalities otherwise.  MRI of the left knee shows a simple left medial meniscus tear that appears more degenerative.  Findings were reviewed with the patient detail and based on these findings I recommended relative rest and placed her on some restrictions for about 6 weeks at work.  She has not experiencing constant or consistent symptoms so we will attempt to conservative and symptomatic management for now.  Continue to take diclofenac as needed.  Follow-up as needed. ? ?Follow-Up Instructions: No follow-ups on file.  ? ?Orders:  ?No orders of the defined types were placed in this encounter. ? ?No orders of the defined types were placed in this encounter. ? ? ? ? Procedures: ?No procedures performed ? ? ?Clinical Data: ?No additional findings. ? ? ?Subjective: ?Chief Complaint  ?Patient presents with  ? Right Knee - Pain  ? Left Knee - Pain  ? ? ?HPI ? ?Ronaldo Miyamoto returns today to review MRI scans of both knees.  Reporting occasional pain that is worse with increased standing and activity. ? ?Review of Systems ? ? ?Objective: ?Vital Signs: There were no vitals taken for this visit. ? ?Physical Exam ? ?Ortho Exam ? ?Examination of bilateral knees is unchanged ? ?Specialty Comments:  ?No specialty comments available. ? ?Imaging: ?No results found. ? ? ?PMFS History: ?Patient Active Problem List  ? Diagnosis Date Noted  ? Chronic pain of both knees 11/23/2021  ? Shortness of breath 01/28/2021  ? Seasonal and perennial allergic rhinoconjunctivitis 01/14/2020  ?  Atopic dermatitis 01/14/2020  ? 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 ?Past Medical History:  ?Diagnosis Date  ? Allergy   ? Atopic dermatitis 01/14/2020  ? Heart murmur   ?  ?Family History  ?Problem Relation Age of Onset  ? Allergic rhinitis Mother   ? Eczema Neg Hx   ? Urticaria Neg Hx   ? Asthma Neg Hx   ?  ?Past Surgical History:  ?Procedure Laterality Date  ? KNEE SURGERY    ? ?Social History  ? ?Occupational History  ? Occupation: Waitress  ?  Employer: [employer]   ?Tobacco Use  ? Smoking status: Every Day  ?  Types: Cigarettes  ? Smokeless tobacco: Never  ?Vaping Use  ? Vaping Use: Never used  ?Substance and Sexual Activity  ? Alcohol use: Yes  ?  Comment: Socially  ? Drug use: No  ? Sexual activity: Not on file  ? ? ? ? ? ? ?

## 2021-12-20 ENCOUNTER — Telehealth: Payer: Self-pay | Admitting: Orthopaedic Surgery

## 2021-12-20 NOTE — Telephone Encounter (Signed)
If she is interested in surgery then she should come back for discussion.  She needs to understand that surgery would be unreliable at relieving the pain.  Happy to discuss further in office if she wants

## 2021-12-20 NOTE — Telephone Encounter (Signed)
Patient calling because she states has been in pain since she left the office on Friday and the left knee is getting worse, not better.  She would like to proceed with having surgery with Dr. Roda Shutters.   ? ?Pt's cb  [phone] ?

## 2021-12-21 ENCOUNTER — Telehealth (INDEPENDENT_AMBULATORY_CARE_PROVIDER_SITE_OTHER): Payer: 59 | Admitting: Orthopaedic Surgery

## 2021-12-21 DIAGNOSIS — G8929 Other chronic pain: Secondary | ICD-10-CM

## 2021-12-21 DIAGNOSIS — M25562 Pain in left knee: Secondary | ICD-10-CM

## 2021-12-21 DIAGNOSIS — M25561 Pain in right knee: Secondary | ICD-10-CM

## 2021-12-21 NOTE — Telephone Encounter (Signed)
I called the patient tonight and listen to her complaints of knee pain.  I also went over the MRI findings thoroughly.  We had a discussion about treatment options.  I explained that given the absence of real findings on MRI, surgery would be very unreliable in improving her pain.  I recommended continuing with conservative management with NSAIDs and physical therapy which she is agreeable to.  I placed an order tonight for her to get physical therapy.  We will see her back as needed.  All questions answered to her satisfaction.  I went ahead and canceled her appointment for Thursday. ?

## 2021-12-21 NOTE — Telephone Encounter (Signed)
Sent mychart message

## 2021-12-21 NOTE — Telephone Encounter (Signed)
Patient has appt

## 2021-12-21 NOTE — Telephone Encounter (Signed)
PT states she was told that an appr was needed- I made the appt, pt states she is not sure why she needs to come in again --she was just there. ? ?Please call the pt  ?

## 2021-12-23 ENCOUNTER — Ambulatory Visit: Payer: 59 | Admitting: Orthopaedic Surgery

## 2021-12-28 ENCOUNTER — Ambulatory Visit: Payer: 59

## 2021-12-30 ENCOUNTER — Ambulatory Visit: Payer: 59

## 2022-01-03 ENCOUNTER — Ambulatory Visit: Payer: 59 | Admitting: Physical Therapy

## 2022-01-03 NOTE — Therapy (Incomplete)
?OUTPATIENT PHYSICAL THERAPY LOWER EXTREMITY EVALUATION ? ? ?Patient Name: Deborah Owens ?MRN: [id number] ?DOB:[birth year], 36 y.o., female ?Today's Date: 01/03/2022 ? ? ? ?Past Medical History:  ?Diagnosis Date  ? Allergy   ? Atopic dermatitis 01/14/2020  ? Heart murmur   ? ?Past Surgical History:  ?Procedure Laterality Date  ? KNEE SURGERY    ? ?Patient Active Problem List  ? Diagnosis Date Noted  ? Chronic pain of both knees 11/23/2021  ? Shortness of breath 01/28/2021  ? Seasonal and perennial allergic rhinoconjunctivitis 01/14/2020  ? Atopic dermatitis 01/14/2020  ? 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 ? ?PCP: Marcelle Overlie, MD ? ?REFERRING PROVIDER:  ? ?REFERRING DIAG: Tarry Kos, MD ? ?THERAPY DIAG:  ?No diagnosis found. ? ?ONSET DATE: chronic pain since ? ?SUBJECTIVE:  ? ?SUBJECTIVE STATEMENT: ?She has had bilateral knee pain ? ?PERTINENT HISTORY: ?no comorbidities, previous knee surgery ? ?PAIN:  ?Are you having pain? Yes: NPRS scale: ***/10 ?Pain location: knees  ?Pain description: *** ?Aggravating factors: *** ?Relieving factors: *** ? ?PRECAUTIONS: None ? ?WEIGHT BEARING RESTRICTIONS No ? ?FALLS:  ?Has patient fallen in last 6 months? {fallsyesno:27318} ? ?OCCUPATION: *** ? ?PLOF: {PLOF:24004} ? ?PATIENT GOALS *** ? ? ?OBJECTIVE:  ? ?DIAGNOSTIC FINDINGS: "MRI of the right knee shows edema and subchondral stress reaction of the lateral tibial plateau.  No structural abnormalities otherwise.  MRI of the left knee shows a simple left medial meniscus tear that appears more degenerative" ? ?PATIENT SURVEYS:  ?FOTO *** ? ?COGNITION: ? Overall cognitive status: Within functional limits for tasks assessed   ?  ?SENSATION: ?{sensation:27233} ? ?MUSCLE LENGTH: ?Hamstrings: Right *** deg; Left *** deg ?Thomas test: Right *** deg; Left *** deg ? ?POSTURE:  ?*** ? ?PALPATION: ?*** ? ?LE ROM: ? ?Active ROM/PROM Right ?01/03/2022 Left ?01/03/2022  ?Hip flexion    ?Hip extension    ?Hip abduction    ?Hip adduction    ?Hip  internal rotation    ?Hip external rotation    ?Knee flexion    ?Knee extension    ?Ankle dorsiflexion    ?Ankle plantarflexion    ?Ankle inversion    ?Ankle eversion    ? (Blank rows = not tested) ? ?LE MMT: ? ?MMT Right ?01/03/2022 Left ?01/03/2022  ?Hip flexion    ?Hip extension    ?Hip abduction    ?Hip adduction    ?Hip internal rotation    ?Hip external rotation    ?Knee flexion    ?Knee extension    ?Ankle dorsiflexion    ?Ankle plantarflexion    ?Ankle inversion    ?Ankle eversion    ? (Blank rows = not tested) ? ?LOWER EXTREMITY SPECIAL TESTS:  ?{LEspecialtests:26242} ? ?FUNCTIONAL TESTS:  ?{Functional tests:24029} ? ?GAIT: ?Distance walked: *** ?Assistive device utilized: {Assistive devices:23999} ?Level of assistance: {Levels of assistance:24026} ?Comments: *** ? ? ? ?TODAY'S TREATMENT: ?*** ? ? ?PATIENT EDUCATION:  ?Education details: *** ?Person educated: {Person educated:25204} ?Education method: {Education Method:25205} ?Education comprehension: {Education Comprehension:25206} ? ? ?HOME EXERCISE PROGRAM: ?*** ? ?ASSESSMENT: ? ? ?ASSESSMENT: ? ?CLINICAL IMPRESSION: Patient presents with signs and symptoms consistent with ***. Patient will benefit from skilled PT to address below impairments, limitations and improve overall function. ? ?OBJECTIVE IMPAIRMENTS: decreased activity tolerance, difficulty walking, decreased balance, decreased endurance, decreased mobility, decreased ROM, decreased strength, impaired flexibility, impaired UE/LE use, postural dysfunction, and pain. ? ?ACTIVITY LIMITATIONS: bending, lifting, carry, locomotion, cleaning, community activity, driving, and or occupation ? ?PERSONAL FACTORS: *** are  also affecting patient's functional outcome. ? ?REHAB POTENTIAL: {rehabpotential:25112} ? ?CLINICAL DECISION MAKING: {clinical decision making:25114} ? ?EVALUATION COMPLEXITY: {Evaluation complexity:25115} ? ? ? ?GOALS: ?Short term PT Goals Target date: {follow up:25551} ?Pt will be I and  compliant with HEP. ?Baseline:  ?Goal status: New ?Pt will decrease pain by 25% overall ?Baseline: ?Goal status: New ? ?Long term PT goals Target date: {follow up:25551} ?Pt will improve ROM to [facility] to improve functional mobility ?Baseline: ?Goal status: New ?Pt will improve  hip/knee strength to at least 5-/5 MMT to improve functional strength ?Baseline: ?Goal status: New ?Pt will improve FOTO to at least % functional to show improved function ?Baseline: ?Goal status: New ?Pt will reduce pain by overall 50% overall with usual activity ?Baseline: ?Goal status: New ?Pt will reduce pain to overall less than 2-3/10 with usual activity and work activity. ?Baseline: ?Goal status: New ?Pt will be able to ambulate community distances at least 1000 ft WNL gait pattern without complaints ?Baseline: ?Goal status: New ? ?PLAN: ?PT FREQUENCY: 1-2 times per week  ? ?PT DURATION: 6-8 weeks ? ?PLANNED INTERVENTIONS (unless contraindicated): aquatic PT, Canalith repositioning, cryotherapy, Electrical stimulation, Iontophoresis with 4 mg/ml dexamethasome, Moist heat, traction, Ultrasound, gait training, Therapeutic exercise, balance training, neuromuscular re-education, patient/family education, prosthetic training, manual techniques, passive ROM, dry needling, taping, vasopnuematic device, vestibular, spinal manipulations, joint manipulations ? ?PLAN FOR NEXT SESSION: *** ? ? ? ?April Manson, PT ?01/03/2022, 8:19 AM ? ?

## 2022-01-24 NOTE — Therapy (Signed)
OUTPATIENT PHYSICAL THERAPY LOWER EXTREMITY EVALUATION   Patient Name: DESTONY ORI MRN: [id number] DOB:06-10-1986, 36 y.o., female Today's Date: 01/25/2022   PT End of Session - 01/25/22 1610     Visit Number 1    Number of Visits 16    PT Start Time 1520    PT Stop Time 1602    PT Time Calculation (min) 42 min    Activity Tolerance Patient tolerated treatment well    Behavior During Therapy [facility] for tasks assessed/performed             Past Medical History:  Diagnosis Date   Allergy    Atopic dermatitis 01/14/2020   Heart murmur    Past Surgical History:  Procedure Laterality Date   KNEE SURGERY     Patient Active Problem List   Diagnosis Date Noted   Chronic pain of both knees 11/23/2021   Shortness of breath 01/28/2021   Seasonal and perennial allergic rhinoconjunctivitis 01/14/2020   Atopic dermatitis 01/14/2020   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  Emma-Lee notes her symptoms have been getting worse over the past several months.  She works 2 jobs 7 days a week and notes the knees are starting to affect her work.  PERTINENT HISTORY: Previous R knee surgery, SOB  PAIN:  Are you having pain? Yes: NPRS scale: L knee 8/10 at worst and R side 4/10 at worst on a 0-10/10 Pain location: B knees Pain description: Can be sharp (feels like it needs to pop R), L ache Aggravating factors: Getting up from a squat Relieving factors: Ice,  advil  PRECAUTIONS: Try to avoid knee hyperextension  WEIGHT BEARING RESTRICTIONS No  FALLS:  Has patient fallen in last 6 months? No  LIVING ENVIRONMENT: Lives with: lives alone Lives in: House/apartment Stairs:  Sometimes has problems with descending Has following equipment at home: None  OCCUPATION: Stands all day at 2 different jobs  PLOF: Independent  PATIENT GOALS Be able to stand, walk and work without being limited by B knee pain   OBJECTIVE:   DIAGNOSTIC FINDINGS:  L Knee IMPRESSION: 1. There is focal intermediate signal degenerative change within the peripheral third of the meniscal triangle of the mid AP dimension of the body of the medial meniscus with degenerative fraying and elongation of the meniscal triangle superiorly. 2. Intact cruciate ligaments and collateral ligaments.  R knee IMPRESSION: 1. Focal bone marrow edema at the posterior aspect of the lateral tibial plateau with subtle nondepressed subchondral fracture line. 2. Faint intramuscular edema within the medial and lateral heads of the gastrocnemius muscle, which may reflect muscle strains. 3. Distal quadriceps tendon intact with minimal tendinosis distally. 4. Postsurgical changes from prior extensor mechanism realignment. 5. Trace joint effusion.  PATIENT SURVEYS:  FOTO Next visit  COGNITION:  Overall cognitive status: Within functional limits for tasks assessed     SENSATION: No complaints of tingling or numbness  MUSCLE LENGTH: Hamstrings: Right 45 deg; Left 40 deg Quadriceps: Right 115 deg; Left  100 deg   LOWER EXTREMITY ROM:  Active ROM Right eval Left eval  Hip flexion    Hip extension    Hip abduction    Hip adduction    Hip internal rotation    Hip external rotation    Knee flexion 135 134  Knee extension 0 0  Ankle dorsiflexion    Ankle plantarflexion    Ankle inversion    Ankle eversion     (Blank rows = not tested)  LOWER EXTREMITY MMT:  MMT Right eval  Left eval  Hip flexion    Hip extension    Hip abduction    Hip adduction    Hip internal rotation    Hip external rotation    Knee flexion    Knee extension 37.4 pounds 10.1 pounds  Ankle dorsiflexion    Ankle plantarflexion    Ankle inversion    Ankle eversion     (Blank rows = not tested)    TODAY'S TREATMENT: Quadriceps sets (pillows under knees to avoid hyperextension) 10X 5 seconds  Seated straight leg raises 3 sets of 5 for 3 seconds B  Attempted sit to stand but limited by crepitus and pain   PATIENT EDUCATION:  Education details: Reviewed exam and HEP Person educated: Patient Education method: Consulting civil engineer, Media planner, Verbal cues, and Handouts Education comprehension: verbalized understanding, returned demonstration, verbal cues required, and needs further education   HOME EXERCISE PROGRAM: Access Code: [email or web address] URL: [email or web address] Date: 01/25/2022 Prepared by: Vista Mink  Exercises - Supine Quadricep Sets  - 2-3 x daily - 7 x weekly - 2-3 sets - 10 reps - 5 second hold - Small Range Straight Leg Raise  - 1-2 x daily - 7 x weekly - 4-10 sets - 5 reps - 3 seconds hold  ASSESSMENT:  CLINICAL IMPRESSION: Patient is a 36 y.o. female who was seen today for physical therapy evaluation and treatment for chronic B knee pain.    OBJECTIVE IMPAIRMENTS Abnormal gait, decreased activity tolerance, decreased endurance, decreased knowledge of condition, difficulty walking, decreased strength, increased edema, and pain.   ACTIVITY LIMITATIONS bending, sitting, standing, squatting, sleeping, stairs, and locomotion level  PARTICIPATION LIMITATIONS: community activity and occupation  PERSONAL FACTORS Previous R knee surgery and chronic B knee pain are also affecting patient's functional outcome.   REHAB POTENTIAL: Good  CLINICAL DECISION MAKING: Stable/uncomplicated  EVALUATION COMPLEXITY: Low   GOALS: Goals reviewed with patient?  Yes  SHORT TERM GOALS: Target date: 02/22/2022   Gisselle will be able to produce 30/60 pounds of quadriceps strength (L/R). Baseline: 10/37 Goal status: INITIAL  2.  Emyly will be independent with her day 1 HEP. Baseline: Started 01/25/2022 Goal status: INITIAL   LONG TERM GOALS: Target date: 03/22/2022   Bentlei will report B knee pain to consistently 0-3/10 on the Numeric Pain Rating Scale. Baseline: 4-8/10 Goal status: INITIAL  2.  Improve FOTO score from visit 2. Baseline: See FOTO Goal status: INITIAL  3.  Improve B quadriceps strength as assessed by objective strength measures or FOTO and functional performance (sit to stand without pain and crepitus). Baseline: Painful and noisy sit to stand and quad strength objectively 10-37 pounds Goal status: INITIAL  4.  Jerni will be independent with her long-term HEP at DC. Baseline: Started 01/25/2022 Goal status: INITIAL  PLAN: PT FREQUENCY: 1-2x/week  PT DURATION: 8 weeks  PLANNED INTERVENTIONS: Therapeutic exercises, Therapeutic activity, Neuromuscular re-education, Balance training, Gait training, Patient/Family education, Stair training, Electrical stimulation, Cryotherapy, Vasopneumatic device, and BFR  PLAN FOR NEXT SESSION: Review HEP.  Consider BFR with quadriceps sets and/or electrical stimulation.  Limited range knee extension machine and leg press within comfortable range.  Bike.  B vaso if time.   Farley Ly, PT, MPT 01/25/2022, 4:11 PM

## 2022-01-25 ENCOUNTER — Ambulatory Visit: Payer: 59 | Admitting: Rehabilitative and Restorative Service Providers"

## 2022-01-25 ENCOUNTER — Encounter: Payer: Self-pay | Admitting: Rehabilitative and Restorative Service Providers"

## 2022-01-25 DIAGNOSIS — M6281 Muscle weakness (generalized): Secondary | ICD-10-CM | POA: Diagnosis not present

## 2022-01-25 DIAGNOSIS — R600 Localized edema: Secondary | ICD-10-CM | POA: Diagnosis not present

## 2022-01-25 DIAGNOSIS — G8929 Other chronic pain: Secondary | ICD-10-CM

## 2022-01-25 DIAGNOSIS — R262 Difficulty in walking, not elsewhere classified: Secondary | ICD-10-CM

## 2022-01-25 DIAGNOSIS — M25562 Pain in left knee: Secondary | ICD-10-CM

## 2022-01-25 DIAGNOSIS — M25561 Pain in right knee: Secondary | ICD-10-CM

## 2022-02-02 ENCOUNTER — Ambulatory Visit: Payer: 59 | Admitting: Physical Therapy

## 2022-02-03 ENCOUNTER — Ambulatory Visit (INDEPENDENT_AMBULATORY_CARE_PROVIDER_SITE_OTHER): Payer: 59 | Admitting: Rehabilitative and Restorative Service Providers"

## 2022-02-03 ENCOUNTER — Encounter: Payer: Self-pay | Admitting: Rehabilitative and Restorative Service Providers"

## 2022-02-03 DIAGNOSIS — M6281 Muscle weakness (generalized): Secondary | ICD-10-CM | POA: Diagnosis not present

## 2022-02-03 DIAGNOSIS — R262 Difficulty in walking, not elsewhere classified: Secondary | ICD-10-CM | POA: Diagnosis not present

## 2022-02-03 DIAGNOSIS — M25562 Pain in left knee: Secondary | ICD-10-CM

## 2022-02-03 DIAGNOSIS — M25561 Pain in right knee: Secondary | ICD-10-CM | POA: Diagnosis not present

## 2022-02-03 DIAGNOSIS — R600 Localized edema: Secondary | ICD-10-CM | POA: Diagnosis not present

## 2022-02-03 DIAGNOSIS — G8929 Other chronic pain: Secondary | ICD-10-CM

## 2022-02-03 NOTE — Therapy (Addendum)
OUTPATIENT PHYSICAL THERAPY TREATMENT/DISCHARGE NOTE   Patient Name: Deborah Owens MRN: [id number] DOB:26-Feb-1986, 36 y.o., female Today's Date: 02/03/2022  PCP: Dian Queen, MD  REFERRING PROVIDER: Leandrew Koyanagi, MD  PHYSICAL THERAPY DISCHARGE SUMMARY  Visits from Start of Care: 2  Current functional level related to goals / functional outcomes: See note   Remaining deficits: See note   Education / Equipment: HEP   Patient agrees to discharge. Patient goals were  On Going . Patient is being discharged due to not returning since the last visit.   END OF SESSION:   PT End of Session - 02/03/22 1510     Visit Number 2    Number of Visits 16    PT Start Time 1430    PT Stop Time 1520    PT Time Calculation (min) 50 min    Activity Tolerance Patient tolerated treatment well;No increased pain    Behavior During Therapy WFL for tasks assessed/performed             Past Medical History:  Diagnosis Date   Allergy    Atopic dermatitis 01/14/2020   Heart murmur    Past Surgical History:  Procedure Laterality Date   KNEE SURGERY     Patient Active Problem List   Diagnosis Date Noted   Chronic pain of both knees 11/23/2021   Shortness of breath 01/28/2021   Seasonal and perennial allergic rhinoconjunctivitis 01/14/2020   Atopic dermatitis 01/14/2020   Pollen-food allergy 01/14/2020    REFERRING DIAG: M25.561,M25.562,G89.29 (ICD-10-CM) - Chronic pain of both knees   THERAPY DIAG:  Difficulty in walking, not elsewhere classified  Muscle weakness (generalized)  Localized edema  Chronic pain of right knee  Left knee pain, unspecified chronicity  Rationale for Evaluation and Treatment Rehabilitation  PERTINENT HISTORY: Previous R knee surgery, SOB  PRECAUTIONS: Try to avoid hyperextension  SUBJECTIVE: Deborah Owens reports and demonstrates excellent early HEP compliance.  PAIN:  Are you having pain? Yes: NPRS scale: 1-3/10 this week on a 0-10/10 Pain  location: B knees L > R Pain description: "Feels like they need to pop" Aggravating factors: Prolonged WB Relieving factors: Ice, exercises and rest   OBJECTIVE: (objective measures completed at initial evaluation unless otherwise dated)  OBJECTIVE:    DIAGNOSTIC FINDINGS:  L Knee IMPRESSION: 1. There is focal intermediate signal degenerative change within the peripheral third of the meniscal triangle of the mid AP dimension of the body of the medial meniscus with degenerative fraying and elongation of the meniscal triangle superiorly. 2. Intact cruciate ligaments and collateral ligaments.   R knee IMPRESSION: 1. Focal bone marrow edema at the posterior aspect of the lateral tibial plateau with subtle nondepressed subchondral fracture line. 2. Faint intramuscular edema within the medial and lateral heads of the gastrocnemius muscle, which may reflect muscle strains. 3. Distal quadriceps tendon intact with minimal tendinosis distally. 4. Postsurgical changes from prior extensor mechanism realignment. 5. Trace joint effusion.   PATIENT SURVEYS:  FOTO 34 (Goal 63 in 17 visits)   COGNITION:           Overall cognitive status: Within functional limits for tasks assessed                          SENSATION: No complaints of tingling or numbness   MUSCLE LENGTH: Hamstrings: Right 45 deg; Left 40 deg Quadriceps: Right 115 deg; Left 100 deg     LOWER EXTREMITY ROM:   Active ROM  Right eval Left eval  Hip flexion      Hip extension      Hip abduction      Hip adduction      Hip internal rotation      Hip external rotation      Knee flexion 135 134  Knee extension 0 0  Ankle dorsiflexion      Ankle plantarflexion      Ankle inversion      Ankle eversion       (Blank rows = not tested)   LOWER EXTREMITY MMT:   MMT Right eval Left eval  Hip flexion      Hip extension      Hip abduction      Hip adduction      Hip internal rotation      Hip external rotation       Knee flexion      Knee extension 37.4 pounds 10.1 pounds  Ankle dorsiflexion      Ankle plantarflexion      Ankle inversion      Ankle eversion       (Blank rows = not tested)       TODAY'S TREATMENT: 02/03/2022 Quadriceps sets (pillows under knees to avoid hyperextension) 10X 5 seconds   Seated straight leg raises 3 sets of 5 for 3 seconds B with 1#  Knee estension machine 90-40 degrees 10X B Up B, down on one side (5X each) at 10#, 15# and 20# (start at 20# next visit)  Functional Activities: Leg Press (for sit to stand, stairs) Double leg 75# 3 sets of 5 slow eccentrics (sets of 10 next visit) Single leg 50# 10X each side slow eccentrics  Vasopneumatic L knee Medium 34* and ice pack R knee 10 minutes   01/25/2022 Quadriceps sets (pillows under knees to avoid hyperextension) 10X 5 seconds   Seated straight leg raises 3 sets of 5 for 3 seconds B   Attempted sit to stand but limited by crepitus and pain     PATIENT EDUCATION:  Education details: Reviewed exam and HEP Person educated: Patient Education method: Consulting civil engineer, Demonstration, Verbal cues, and Handouts Education comprehension: verbalized understanding, returned demonstration, verbal cues required, and needs further education     HOME EXERCISE PROGRAM: Access Code: [email or web address] URL: [email or web address] Date: 01/25/2022 Prepared by: Vista Mink   Exercises - Supine Quadricep Sets  - 2-3 x daily - 7 x weekly - 2-3 sets - 10 reps - 5 second hold - Small Range Straight Leg Raise  - 1-2 x daily - 7 x weekly - 4-10 sets - 5 reps - 3 seconds hold   ASSESSMENT:   CLINICAL IMPRESSION: Deborah Owens was great with her ealy HEP compliance.  With her work schedule (2-3 jobs, 7 days a week) I am impressed with her early compliance.  We were able to gauge appropriate resistance with new exercises and minimal correction was needed with her day 1 HEP.  Continue quadriceps strength emphasis and consider BFR or  electrical stimulation next visit.     OBJECTIVE IMPAIRMENTS Abnormal gait, decreased activity tolerance, decreased endurance, decreased knowledge of condition, difficulty walking, decreased strength, increased edema, and pain.    ACTIVITY LIMITATIONS bending, sitting, standing, squatting, sleeping, stairs, and locomotion level   PARTICIPATION LIMITATIONS: community activity and occupation   PERSONAL FACTORS Previous R knee surgery and chronic B knee pain are also affecting patient's functional outcome.    REHAB POTENTIAL: Good   CLINICAL DECISION MAKING: Stable/uncomplicated  EVALUATION COMPLEXITY: Low     GOALS: Goals reviewed with patient? Yes   SHORT TERM GOALS: Target date: 02/22/2022    Deborah Owens will be able to produce 30/60 pounds of quadriceps strength (L/R). Baseline: 10/37 Goal status: INITIAL   2.  Deborah Owens will be independent with her day 1 HEP. Baseline: Started 01/25/2022 Goal status: Met 02/03/2022     LONG TERM GOALS: Target date: 03/22/2022    Deborah Owens will report B knee pain to consistently 0-3/10 on the Numeric Pain Rating Scale. Baseline: 4-8/10 Goal status: INITIAL   2.  Improve FOTO score from visit 2. Baseline: See FOTO Goal status: INITIAL   3.  Improve B quadriceps strength as assessed by objective strength measures or FOTO and functional performance (sit to stand without pain and crepitus). Baseline: Painful and noisy sit to stand and quad strength objectively 10-37 pounds Goal status: INITIAL   4.  Deborah Owens will be independent with her long-term HEP at DC. Baseline: Started 01/25/2022 Goal status: INITIAL   PLAN: PT FREQUENCY: 1-2x/week   PT DURATION: 8 weeks   PLANNED INTERVENTIONS: Therapeutic exercises, Therapeutic activity, Neuromuscular re-education, Balance training, Gait training, Patient/Family education, Stair training, Electrical stimulation, Cryotherapy, Vasopneumatic device, and BFR   PLAN FOR NEXT SESSION: Review HEP.  Weights of last set  appropriate for visit 3.  Consider BFR with quadriceps sets and/or electrical stimulation.  Bike.  B vaso if time.    DC after 2nd straight no show on 03/04/2022   Farley Ly, PT, MPT 02/03/2022, 3:25 PM

## 2022-02-07 ENCOUNTER — Telehealth: Payer: Self-pay | Admitting: Rehabilitative and Restorative Service Providers"

## 2022-02-07 ENCOUNTER — Encounter: Payer: 59 | Admitting: Rehabilitative and Restorative Service Providers"

## 2022-02-07 NOTE — Telephone Encounter (Signed)
Voicemail was full so I sent an email with her next appointment date/time (next Thursday at 11:45) and asked her to call to cancel any future appointments she is unable to attend.  Reminded her (nicely) that 2 no shows means DC.

## 2022-02-17 ENCOUNTER — Encounter: Payer: 59 | Admitting: Rehabilitative and Restorative Service Providers"

## 2022-02-23 ENCOUNTER — Encounter: Payer: 59 | Admitting: Rehabilitative and Restorative Service Providers"

## 2022-02-25 ENCOUNTER — Encounter: Payer: 59 | Admitting: Rehabilitative and Restorative Service Providers"

## 2022-03-04 ENCOUNTER — Encounter: Payer: 59 | Admitting: Rehabilitative and Restorative Service Providers"

## 2022-03-28 NOTE — Progress Notes (Deleted)
Follow Up Note  RE: MANAR SMALLING MRN: [id number] DOB: [birth year] Date of Office Visit: 03/29/2022  Referring provider: Marcelle Overlie, MD Primary care provider: Marcelle Overlie, MD  Chief Complaint: No chief complaint on file.  History of Present Illness: I had the pleasure of seeing Deborah Owens for a follow up visit at the [facility] on 03/28/2022. She is a 36 y.o. female, who is being followed for allergic rhinoconjunctivitis, oral allergy syndrome, shortness of breath and atopic dermatitis. Her previous allergy office visit was on 11/23/2021 with Dr. Selena Batten. Today is a regular follow up visit.  Seasonal and perennial allergic rhinoconjunctivitis Past history - started AIT on 02/04/2020 (G-W-T and RW-DM-C-D-Horse-CR). Stopped AIT in November 2021 Interim history - ran out of meds and having flares, interested in restarting AIT in the [place] office.  Restart allergy injections - make appointment for 3 weeks in the [place] office.  See below for environmental control measures.  Use Xyzal (levocetirizine) daily as needed. May take twice a day during allergy flares.  May use Flonase (fluticasone) nasal spray 1 spray per nostril twice a day as needed for nasal congestion.  May use azelastine eye drops 1 drop in each eye twice a day as needed for itchy/watery eyes.    Pollen-food allergy Continue to avoid fruits that are bothersome. For mild symptoms you can take over the counter antihistamines such as Benadryl and monitor symptoms closely. If symptoms worsen or if you have severe symptoms including breathing issues, throat closure, significant swelling, whole body hives, severe diarrhea and vomiting, lightheadedness then inject epinephrine and seek immediate medical care afterwards.   Shortness of breath Past history - 2022 normal spirometry. Interim history - last used albuterol 2 months ago. May use albuterol rescue inhaler 2 puffs every 4 to 6 hours as needed for  shortness of breath, chest tightness, coughing, and wheezing. Monitor frequency of use.    Atopic dermatitis Past history - Mometasone burns. Interim history - started Dupixent in June 2022. Stopped in January 2023 with no major flares. Skin is doing much better.  Continue skin care as below. Hold Dupixent injections. If eczema worsens then can restart.  May use desonide 0.05% ointment sparingly to affected areas twice daily as needed to the face and/or neck. Care is to be taken to avoid the eyes. Use triamcinolone 0.1% ointment twice a day as needed for eczema flares. Do not use on the face, neck, armpits or groin area. Do not use more than 3 weeks in a row.  May use hydroxyzine 10mg  as needed for itching.    Return in about 4 months (around 03/25/2022).  Assessment and Plan: Deborah Owens is a 36 y.o. female with: No problem-specific Assessment & Plan notes found for this encounter.  No follow-ups on file.  No orders of the defined types were placed in this encounter.  Lab Orders  No laboratory test(s) ordered today    Diagnostics: Spirometry:  Tracings reviewed. Her effort: {Blank single:19197::"Good reproducible efforts.","It was hard to get consistent efforts and there is a question as to whether this reflects a maximal maneuver.","Poor effort, data can not be interpreted."} FVC: ***L FEV1: ***L, ***% predicted FEV1/FVC ratio: ***% Interpretation: {Blank single:19197::"Spirometry consistent with mild obstructive disease","Spirometry consistent with moderate obstructive disease","Spirometry consistent with severe obstructive disease","Spirometry consistent with possible restrictive disease","Spirometry consistent with mixed obstructive and restrictive disease","Spirometry uninterpretable due to technique","Spirometry consistent with normal pattern","No overt abnormalities noted given today's efforts"}.  Please see scanned spirometry results  for details.  Skin Testing: {Blank  single:19197::"Select foods","Environmental allergy panel","Environmental allergy panel and select foods","Food allergy panel","None","Deferred due to recent antihistamines use"}. *** Results discussed with patient/family.   Medication List:  Current Outpatient Medications  Medication Sig Dispense Refill   albuterol (VENTOLIN HFA) 108 (90 Base) MCG/ACT inhaler Inhale 2 puffs into the lungs every 4 (four) hours as needed for wheezing or shortness of breath (coughing fits). 18 g 1   azelastine (OPTIVAR) 0.05 % ophthalmic solution Place 1 drop into both eyes 2 (two) times daily as needed (itchy/watery eyes). 6 mL 5   desonide (DESOWEN) 0.05 % ointment Apply 1 application. topically 2 (two) times daily as needed (mild rash flare). Okay to use on the face, neck, groin area. Do not use more than 1 week at a time. 60 g 2   diclofenac (VOLTAREN) 75 MG EC tablet Take 1 tablet (75 mg total) by mouth 2 (two) times daily. 30 tablet 2   diclofenac Sodium (VOLTAREN) 1 % GEL Apply 2 g topically 4 (four) times daily. 150 g 0   EPINEPHrine (AUVI-Q) 0.3 mg/0.3 mL IJ SOAJ injection Inject 0.3 mg into the muscle as needed for anaphylaxis. 1 each 2   EPINEPHrine 0.3 mg/0.3 mL IJ SOAJ injection Inject 0.3 mg into the muscle as needed for anaphylaxis. 1 each 2   fluticasone (FLONASE) 50 MCG/ACT nasal spray Place 1 spray into both nostrils in the morning and at bedtime. 16 g 5   hydrOXYzine (ATARAX) 10 MG tablet Take 1 tablet (10 mg total) by mouth daily as needed. 30 tablet 5   ibuprofen (ADVIL) 800 MG tablet Take 1 tablet (800 mg total) by mouth 3 (three) times daily. 21 tablet 0   levocetirizine (XYZAL) 5 MG tablet Take 1 tablet (5 mg total) by mouth every evening. 30 tablet 5   methocarbamol (ROBAXIN) 500 MG tablet Take 500 mg by mouth 2 (two) times daily.     nitrofurantoin, macrocrystal-monohydrate, (MACROBID) 100 MG capsule Take 1 capsule (100 mg total) by mouth 2 (two) times daily. 10 capsule 0   triamcinolone  ointment (KENALOG) 0.1 % Apply 1 application topically 2 (two) times daily as needed (rash). Do not use on the face, neck, armpits or groin area. Do not use more than 3 weeks in a row. 30 g 2   valACYclovir (VALTREX) 500 MG tablet Take 500 mg by mouth daily.     Current Facility-Administered Medications  Medication Dose Route Frequency Provider Last Rate Last Admin   dupilumab (DUPIXENT) prefilled syringe 600 mg  600 mg Subcutaneous Q14 Days Garnet Sierras, DO   600 mg at 02/11/21 1717   Allergies: Allergies  Allergen Reactions   Amoxicillin Nausea And Vomiting and Other (See Comments)   I reviewed her past medical history, social history, family history, and environmental history and no significant changes have been reported from her previous visit.  Review of Systems  Constitutional:  Negative for appetite change, chills, fever and unexpected weight change.  HENT:  Positive for congestion. Negative for rhinorrhea and sneezing.   Eyes:  Positive for itching.  Respiratory:  Negative for cough, chest tightness, shortness of breath and wheezing.   Cardiovascular:  Negative for chest pain.  Gastrointestinal:  Negative for abdominal pain.  Genitourinary:  Negative for difficulty urinating.  Skin:  Positive for rash.  Allergic/Immunologic: Positive for environmental allergies and food allergies.  Neurological:  Negative for headaches.    Objective: There were no vitals taken for this visit. There  is no height or weight on file to calculate BMI. Physical Exam Vitals and nursing note reviewed.  Constitutional:      Appearance: Normal appearance. She is well-developed.  HENT:     Head: Normocephalic and atraumatic.     Right Ear: Tympanic membrane and external ear normal.     Left Ear: Tympanic membrane and external ear normal.     Nose: Nose normal.     Mouth/Throat:     Mouth: Mucous membranes are moist.     Pharynx: Oropharynx is clear.  Eyes:     Conjunctiva/sclera: Conjunctivae  normal.  Cardiovascular:     Rate and Rhythm: Normal rate and regular rhythm.     Heart sounds: Normal heart sounds. No murmur heard. Pulmonary:     Effort: Pulmonary effort is normal.     Breath sounds: Normal breath sounds. No wheezing, rhonchi or rales.  Musculoskeletal:     Cervical back: Neck supple.  Skin:    General: Skin is warm.     Findings: Rash present.     Comments: Mild dry patch on the right periorbital area.   Neurological:     Mental Status: She is alert and oriented to person, place, and time.  Psychiatric:        Behavior: Behavior normal.    Previous notes and tests were reviewed. The plan was reviewed with the patient/family, and all questions/concerned were addressed.  It was my pleasure to see Leahna today and participate in her care. Please feel free to contact me with any questions or concerns.  Sincerely,  Wyline Mood, DO Allergy & Immunology  [facility] office: [phone] [facility] office: [phone]

## 2022-03-29 ENCOUNTER — Ambulatory Visit: Payer: Self-pay | Admitting: Allergy

## 2022-03-29 DIAGNOSIS — J309 Allergic rhinitis, unspecified: Secondary | ICD-10-CM

## 2022-03-29 DIAGNOSIS — L2089 Other atopic dermatitis: Secondary | ICD-10-CM

## 2022-03-29 DIAGNOSIS — H1010 Acute atopic conjunctivitis, unspecified eye: Secondary | ICD-10-CM

## 2022-03-29 DIAGNOSIS — T781XXD Other adverse food reactions, not elsewhere classified, subsequent encounter: Secondary | ICD-10-CM

## 2022-03-29 DIAGNOSIS — R0602 Shortness of breath: Secondary | ICD-10-CM

## 2023-01-20 ENCOUNTER — Other Ambulatory Visit: Payer: Self-pay | Admitting: Allergy

## 2023-01-24 ENCOUNTER — Other Ambulatory Visit: Payer: Self-pay | Admitting: Allergy
